# Patient Record
Sex: Male | Born: 1935 | Race: Black or African American | Hispanic: No | Smoking: Former smoker
Health system: Southern US, Community
[De-identification: ages and names within clinical notes are randomized; demographics above are authoritative.]

## PROBLEM LIST (undated history)

## (undated) DIAGNOSIS — R51 Headache: Secondary | ICD-10-CM

## (undated) DIAGNOSIS — R0989 Other specified symptoms and signs involving the circulatory and respiratory systems: Secondary | ICD-10-CM

## (undated) DIAGNOSIS — K219 Gastro-esophageal reflux disease without esophagitis: Secondary | ICD-10-CM

## (undated) DIAGNOSIS — Z8719 Personal history of other diseases of the digestive system: Secondary | ICD-10-CM

## (undated) DIAGNOSIS — D509 Iron deficiency anemia, unspecified: Secondary | ICD-10-CM

## (undated) DIAGNOSIS — M1990 Unspecified osteoarthritis, unspecified site: Secondary | ICD-10-CM

## (undated) DIAGNOSIS — E7800 Pure hypercholesterolemia, unspecified: Secondary | ICD-10-CM

## (undated) DIAGNOSIS — R519 Headache, unspecified: Secondary | ICD-10-CM

## (undated) DIAGNOSIS — R32 Unspecified urinary incontinence: Secondary | ICD-10-CM

## (undated) DIAGNOSIS — Z8711 Personal history of peptic ulcer disease: Secondary | ICD-10-CM

## (undated) DIAGNOSIS — I4510 Unspecified right bundle-branch block: Secondary | ICD-10-CM

## (undated) DIAGNOSIS — I10 Essential (primary) hypertension: Secondary | ICD-10-CM

## (undated) HISTORY — DX: Gastro-esophageal reflux disease without esophagitis: K21.9

## (undated) HISTORY — PX: LUMBAR SPINE SURGERY: SHX701

## (undated) HISTORY — DX: Other specified symptoms and signs involving the circulatory and respiratory systems: R09.89

## (undated) HISTORY — PX: INGUINAL HERNIA REPAIR: SUR1180

## (undated) HISTORY — DX: Unspecified osteoarthritis, unspecified site: M19.90

## (undated) HISTORY — DX: Personal history of other diseases of the digestive system: Z87.19

## (undated) HISTORY — DX: Iron deficiency anemia, unspecified: D50.9

## (undated) HISTORY — DX: Pure hypercholesterolemia, unspecified: E78.00

## (undated) HISTORY — DX: Headache, unspecified: R51.9

## (undated) HISTORY — DX: Unspecified right bundle-branch block: I45.10

## (undated) HISTORY — DX: Essential (primary) hypertension: I10

## (undated) HISTORY — DX: Unspecified urinary incontinence: R32

## (undated) HISTORY — PX: HEMORROIDECTOMY: SUR656

## (undated) HISTORY — DX: Headache: R51

## (undated) HISTORY — DX: Personal history of peptic ulcer disease: Z87.11

---

## 1994-12-21 HISTORY — PX: BACK SURGERY: SHX140

## 2001-03-29 ENCOUNTER — Other Ambulatory Visit: Admission: RE | Admit: 2001-03-29 | Discharge: 2001-03-29 | Payer: Self-pay | Admitting: Urology

## 2001-04-01 ENCOUNTER — Ambulatory Visit (HOSPITAL_COMMUNITY): Admission: RE | Admit: 2001-04-01 | Discharge: 2001-04-01 | Payer: Self-pay | Admitting: Urology

## 2001-04-01 ENCOUNTER — Encounter: Payer: Self-pay | Admitting: Urology

## 2001-10-11 ENCOUNTER — Other Ambulatory Visit: Admission: RE | Admit: 2001-10-11 | Discharge: 2001-10-11 | Payer: Self-pay | Admitting: Urology

## 2002-07-24 ENCOUNTER — Other Ambulatory Visit: Admission: RE | Admit: 2002-07-24 | Discharge: 2002-07-24 | Payer: Self-pay | Admitting: Urology

## 2003-02-12 ENCOUNTER — Encounter: Payer: Self-pay | Admitting: Emergency Medicine

## 2003-02-12 ENCOUNTER — Emergency Department (HOSPITAL_COMMUNITY): Admission: EM | Admit: 2003-02-12 | Discharge: 2003-02-12 | Payer: Self-pay | Admitting: Emergency Medicine

## 2003-10-18 ENCOUNTER — Ambulatory Visit (HOSPITAL_COMMUNITY): Admission: RE | Admit: 2003-10-18 | Discharge: 2003-10-18 | Payer: Self-pay | Admitting: Internal Medicine

## 2003-10-22 ENCOUNTER — Ambulatory Visit (HOSPITAL_COMMUNITY): Admission: RE | Admit: 2003-10-22 | Discharge: 2003-10-22 | Payer: Self-pay | Admitting: Internal Medicine

## 2004-04-11 ENCOUNTER — Ambulatory Visit (HOSPITAL_COMMUNITY): Admission: RE | Admit: 2004-04-11 | Discharge: 2004-04-11 | Payer: Self-pay | Admitting: Internal Medicine

## 2004-06-26 ENCOUNTER — Emergency Department (HOSPITAL_COMMUNITY): Admission: EM | Admit: 2004-06-26 | Discharge: 2004-06-26 | Payer: Self-pay | Admitting: Emergency Medicine

## 2005-02-10 ENCOUNTER — Ambulatory Visit (HOSPITAL_COMMUNITY): Admission: RE | Admit: 2005-02-10 | Discharge: 2005-02-10 | Payer: Self-pay | Admitting: Family Medicine

## 2005-03-03 ENCOUNTER — Ambulatory Visit (HOSPITAL_COMMUNITY): Admission: RE | Admit: 2005-03-03 | Discharge: 2005-03-03 | Payer: Self-pay | Admitting: Internal Medicine

## 2005-03-05 ENCOUNTER — Ambulatory Visit (HOSPITAL_COMMUNITY): Admission: RE | Admit: 2005-03-05 | Discharge: 2005-03-05 | Payer: Self-pay | Admitting: Internal Medicine

## 2005-03-12 ENCOUNTER — Ambulatory Visit (HOSPITAL_COMMUNITY): Admission: RE | Admit: 2005-03-12 | Discharge: 2005-03-12 | Payer: Self-pay

## 2005-03-17 ENCOUNTER — Ambulatory Visit (HOSPITAL_COMMUNITY): Admission: RE | Admit: 2005-03-17 | Discharge: 2005-03-17 | Payer: Self-pay

## 2006-02-23 ENCOUNTER — Ambulatory Visit (HOSPITAL_COMMUNITY): Admission: RE | Admit: 2006-02-23 | Discharge: 2006-02-23 | Payer: Self-pay | Admitting: Internal Medicine

## 2007-08-19 ENCOUNTER — Ambulatory Visit: Payer: Self-pay

## 2009-01-29 ENCOUNTER — Ambulatory Visit (HOSPITAL_COMMUNITY): Admission: RE | Admit: 2009-01-29 | Discharge: 2009-01-29 | Payer: Self-pay | Admitting: Urology

## 2009-09-01 ENCOUNTER — Emergency Department (HOSPITAL_COMMUNITY): Admission: EM | Admit: 2009-09-01 | Discharge: 2009-09-01 | Payer: Self-pay | Admitting: Emergency Medicine

## 2009-09-02 ENCOUNTER — Ambulatory Visit (HOSPITAL_COMMUNITY): Admission: RE | Admit: 2009-09-02 | Discharge: 2009-09-02 | Payer: Self-pay | Admitting: Family Medicine

## 2009-10-01 ENCOUNTER — Encounter (INDEPENDENT_AMBULATORY_CARE_PROVIDER_SITE_OTHER): Payer: Self-pay | Admitting: Orthopedic Surgery

## 2009-10-01 ENCOUNTER — Ambulatory Visit (HOSPITAL_COMMUNITY): Admission: RE | Admit: 2009-10-01 | Discharge: 2009-10-01 | Payer: Self-pay | Admitting: Orthopedic Surgery

## 2011-01-11 ENCOUNTER — Encounter: Payer: Self-pay | Admitting: Urology

## 2011-02-17 ENCOUNTER — Institutional Professional Consult (permissible substitution) (INDEPENDENT_AMBULATORY_CARE_PROVIDER_SITE_OTHER): Payer: Medicare Other | Admitting: Cardiovascular Disease

## 2011-02-17 DIAGNOSIS — I4510 Unspecified right bundle-branch block: Secondary | ICD-10-CM

## 2011-02-17 DIAGNOSIS — I119 Hypertensive heart disease without heart failure: Secondary | ICD-10-CM

## 2011-04-07 LAB — CREATININE, SERUM: Creatinine, Ser: 1.59 mg/dL — ABNORMAL HIGH (ref 0.4–1.5)

## 2011-05-08 NOTE — Procedures (Signed)
NAMEELRIDGE, STEMM                  ACCOUNT NO.:  [id number]   MEDICAL RECORD NO.:  [id number]          PATIENT TYPE:  OUT   LOCATION:                                FACILITY:  [facility]   PHYSICIAN:  Kofi A. Gerilyn Pilgrim, M.D. DATE OF BIRTH:  Mar 05, 1927   DATE OF PROCEDURE:  DATE OF DISCHARGE:                                EEG INTERPRETATION   HISTORY:  This is a 77 year old man who has syncopal episodes suspicious for  nonconvulsive seizures.   ANALYSIS:  A 16-channel recording is conducted for 28 minutes.  There is a  posterior dominant rhythm of 10 Hz.  Awake activity is noted throughout the  recording with some occasional drowsiness observed.  Photic stimulation does  not elicit any significant changes in the background activity.  There is no  focal slowing, lateralized slowing or epileptiform activity observed.   IMPRESSION:  This is a normal recording of the awake state.  If clinically  indicated a sleep-deprived recording could be useful.      KAD/MEDQ  D:  03/09/2005  T:  03/09/2005  Job:  [id number]

## 2011-10-09 ENCOUNTER — Other Ambulatory Visit (HOSPITAL_COMMUNITY): Payer: Self-pay | Admitting: Internal Medicine

## 2011-10-09 DIAGNOSIS — R109 Unspecified abdominal pain: Secondary | ICD-10-CM

## 2011-10-20 ENCOUNTER — Ambulatory Visit (HOSPITAL_COMMUNITY)
Admission: RE | Admit: 2011-10-20 | Discharge: 2011-10-20 | Disposition: A | Discharge status: Home / Self Care | Payer: Medicare Other | Source: Ambulatory Visit | Attending: Internal Medicine | Admitting: Internal Medicine

## 2011-10-20 DIAGNOSIS — R109 Unspecified abdominal pain: Secondary | ICD-10-CM | POA: Insufficient documentation

## 2011-10-20 DIAGNOSIS — Q619 Cystic kidney disease, unspecified: Secondary | ICD-10-CM | POA: Insufficient documentation

## 2012-05-13 ENCOUNTER — Encounter: Payer: Self-pay | Admitting: *Deleted

## 2012-07-01 ENCOUNTER — Ambulatory Visit: Payer: Self-pay | Admitting: Family Medicine

## 2012-08-30 ENCOUNTER — Ambulatory Visit: Payer: Self-pay | Admitting: Internal Medicine

## 2012-08-30 LAB — CK-MB: CK-MB: 2.4 ng/mL (ref 0.5–3.6)

## 2012-08-31 LAB — BASIC METABOLIC PANEL
Anion Gap: 9 (ref 7–16)
Calcium, Total: 8.4 mg/dL — ABNORMAL LOW (ref 8.5–10.1)
Chloride: 107 mmol/L (ref 98–107)
EGFR (Non-African Amer.): 42 — ABNORMAL LOW
Glucose: 84 mg/dL (ref 65–99)
Osmolality: 280 (ref 275–301)
Potassium: 4.2 mmol/L (ref 3.5–5.1)

## 2012-09-23 ENCOUNTER — Inpatient Hospital Stay: Payer: Self-pay | Admitting: Family Medicine

## 2012-09-23 LAB — URINALYSIS, COMPLETE
Bilirubin,UR: NEGATIVE
Glucose,UR: NEGATIVE mg/dL (ref 0–75)
Ketone: NEGATIVE
Nitrite: NEGATIVE
Ph: 5 (ref 4.5–8.0)
Protein: NEGATIVE
RBC,UR: <1 /HPF (ref 0–5)
Specific Gravity: 1.01 (ref 1.003–1.030)
WBC UR: <1 /HPF (ref 0–5)

## 2012-09-23 LAB — CBC
HCT: 15.6 % — ABNORMAL LOW (ref 40.0–52.0)
MCV: 67 fL — ABNORMAL LOW (ref 80–100)
Platelet: 221 10*3/uL (ref 150–440)
RBC: 2.31 10*6/uL — ABNORMAL LOW (ref 4.40–5.90)
RDW: 16.6 % — ABNORMAL HIGH (ref 11.5–14.5)
WBC: 4.3 10*3/uL (ref 3.8–10.6)

## 2012-09-23 LAB — COMPREHENSIVE METABOLIC PANEL
Albumin: 3.4 g/dL (ref 3.4–5.0)
Alkaline Phosphatase: 56 U/L (ref 50–136)
Anion Gap: 11 (ref 7–16)
Bilirubin,Total: 0.3 mg/dL (ref 0.2–1.0)
Calcium, Total: 8.6 mg/dL (ref 8.5–10.1)
Chloride: 109 mmol/L — ABNORMAL HIGH (ref 98–107)
Co2: 21 mmol/L (ref 21–32)
Creatinine: 2.23 mg/dL — ABNORMAL HIGH (ref 0.60–1.30)
Osmolality: 288 (ref 275–301)
SGOT(AST): 20 U/L (ref 15–37)
Total Protein: 7.5 g/dL (ref 6.4–8.2)

## 2012-09-23 LAB — TROPONIN I
Troponin-I: <0.02 ng/mL
Troponin-I: <0.02 ng/mL

## 2012-09-23 LAB — LIPASE, BLOOD: Lipase: 183 U/L (ref 73–393)

## 2012-09-23 LAB — IRON AND TIBC
Iron Bind.Cap.(Total): 423 ug/dL (ref 250–450)
Unbound Iron-Bind.Cap.: 407 ug/dL

## 2012-09-23 LAB — LACTATE DEHYDROGENASE: LDH: 167 U/L (ref 85–241)

## 2012-09-23 LAB — FERRITIN: Ferritin (ARMC): 7 ng/mL — ABNORMAL LOW (ref 8–388)

## 2012-09-23 LAB — CK TOTAL AND CKMB (NOT AT ARMC)
CK, Total: 169 U/L (ref 35–232)
CK-MB: 2.5 ng/mL (ref 0.5–3.6)

## 2012-09-23 LAB — HEMOGLOBIN: HGB: 5 g/dL — CL (ref 13.0–18.0)

## 2012-09-24 LAB — COMPREHENSIVE METABOLIC PANEL
Albumin: 3.2 g/dL — ABNORMAL LOW (ref 3.4–5.0)
Alkaline Phosphatase: 43 U/L — ABNORMAL LOW (ref 50–136)
Anion Gap: 11 (ref 7–16)
BUN: 29 mg/dL — ABNORMAL HIGH (ref 7–18)
Calcium, Total: 8.4 mg/dL — ABNORMAL LOW (ref 8.5–10.1)
Co2: 23 mmol/L (ref 21–32)
EGFR (Non-African Amer.): 28 — ABNORMAL LOW
Glucose: 89 mg/dL (ref 65–99)
Osmolality: 288 (ref 275–301)
Potassium: 4.2 mmol/L (ref 3.5–5.1)
SGOT(AST): 15 U/L (ref 15–37)
SGPT (ALT): 13 U/L (ref 12–78)
Sodium: 142 mmol/L (ref 136–145)

## 2012-09-24 LAB — CBC WITH DIFFERENTIAL/PLATELET
Basophil #: 0 10*3/uL (ref 0.0–0.1)
Basophil %: 0.9 %
Eosinophil #: 0.1 10*3/uL (ref 0.0–0.7)
HCT: 20.5 % — ABNORMAL LOW (ref 40.0–52.0)
HGB: 6.9 g/dL — ABNORMAL LOW (ref 13.0–18.0)
Lymphocyte %: 17.8 %
MCH: 24.2 pg — ABNORMAL LOW (ref 26.0–34.0)
MCHC: 33.8 g/dL (ref 32.0–36.0)
Monocyte #: 0.3 x10 3/mm (ref 0.2–1.0)
Monocyte %: 7.4 %
Neutrophil #: 3.1 10*3/uL (ref 1.4–6.5)
RBC: 2.85 10*6/uL — ABNORMAL LOW (ref 4.40–5.90)
RDW: 20.2 % — ABNORMAL HIGH (ref 11.5–14.5)
WBC: 4.3 10*3/uL (ref 3.8–10.6)

## 2012-09-24 LAB — HEMOGLOBIN: HGB: 9.5 g/dL — ABNORMAL LOW (ref 13.0–18.0)

## 2012-09-24 LAB — PROTIME-INR: Prothrombin Time: 15 secs — ABNORMAL HIGH (ref 11.5–14.7)

## 2012-09-25 LAB — CBC WITH DIFFERENTIAL/PLATELET
Basophil %: 0.7 %
Eosinophil #: 0.1 10*3/uL (ref 0.0–0.7)
Eosinophil %: 2.1 %
HCT: 27.7 % — ABNORMAL LOW (ref 40.0–52.0)
HGB: 9.3 g/dL — ABNORMAL LOW (ref 13.0–18.0)
Lymphocyte #: 0.8 10*3/uL — ABNORMAL LOW (ref 1.0–3.6)
Lymphocyte %: 21.5 %
MCV: 75 fL — ABNORMAL LOW (ref 80–100)
Monocyte #: 0.3 x10 3/mm (ref 0.2–1.0)
Monocyte %: 8.1 %
Neutrophil #: 2.4 10*3/uL (ref 1.4–6.5)
Neutrophil %: 67.6 %
RBC: 3.71 10*6/uL — ABNORMAL LOW (ref 4.40–5.90)
RDW: 21.2 % — ABNORMAL HIGH (ref 11.5–14.5)
WBC: 3.6 10*3/uL — ABNORMAL LOW (ref 3.8–10.6)

## 2012-09-25 LAB — BASIC METABOLIC PANEL
Anion Gap: 11 (ref 7–16)
BUN: 24 mg/dL — ABNORMAL HIGH (ref 7–18)
Chloride: 109 mmol/L — ABNORMAL HIGH (ref 98–107)
EGFR (Non-African Amer.): 35 — ABNORMAL LOW
Osmolality: 283 (ref 275–301)
Potassium: 4.4 mmol/L (ref 3.5–5.1)
Sodium: 140 mmol/L (ref 136–145)

## 2012-09-26 LAB — CBC WITH DIFFERENTIAL/PLATELET
Basophil #: 0 10*3/uL (ref 0.0–0.1)
Basophil %: 0.4 %
Eosinophil #: 0 10*3/uL (ref 0.0–0.7)
Eosinophil %: 1 %
HCT: 29.2 % — ABNORMAL LOW (ref 40.0–52.0)
HGB: 9.5 g/dL — ABNORMAL LOW (ref 13.0–18.0)
Lymphocyte %: 18.7 %
MCHC: 32.6 g/dL (ref 32.0–36.0)
Monocyte %: 7.4 %
Neutrophil %: 72.5 %
Platelet: 190 10*3/uL (ref 150–440)
RBC: 3.86 10*6/uL — ABNORMAL LOW (ref 4.40–5.90)
WBC: 4.3 10*3/uL (ref 3.8–10.6)

## 2012-09-26 LAB — BASIC METABOLIC PANEL
Anion Gap: 9 (ref 7–16)
Calcium, Total: 8.4 mg/dL — ABNORMAL LOW (ref 8.5–10.1)
Chloride: 109 mmol/L — ABNORMAL HIGH (ref 98–107)
Co2: 22 mmol/L (ref 21–32)
Glucose: 92 mg/dL (ref 65–99)
Potassium: 4.5 mmol/L (ref 3.5–5.1)
Sodium: 140 mmol/L (ref 136–145)

## 2012-09-28 LAB — CBC WITH DIFFERENTIAL/PLATELET
Basophil %: 0.6 %
Eosinophil #: 0 10*3/uL (ref 0.0–0.7)
Eosinophil #: 0 10*3/uL (ref 0.0–0.7)
Eosinophil %: 1.3 %
Eosinophil %: 1 %
HCT: 27.9 % — ABNORMAL LOW (ref 40.0–52.0)
HGB: 8.4 g/dL — ABNORMAL LOW (ref 13.0–18.0)
Lymphocyte %: 22.5 %
Lymphocyte %: 22.1 %
MCH: 25.4 pg — ABNORMAL LOW (ref 26.0–34.0)
MCHC: 33.2 g/dL (ref 32.0–36.0)
Monocyte #: 0.3 x10 3/mm (ref 0.2–1.0)
Monocyte %: 9.7 %
Neutrophil #: 2.3 10*3/uL (ref 1.4–6.5)
Neutrophil %: 65.9 %
Neutrophil %: 67.6 %
Platelet: 170 10*3/uL (ref 150–440)
RBC: 3.34 10*6/uL — ABNORMAL LOW (ref 4.40–5.90)
RBC: 3.64 10*6/uL — ABNORMAL LOW (ref 4.40–5.90)
RDW: 22.1 % — ABNORMAL HIGH (ref 11.5–14.5)
WBC: 3.4 10*3/uL — ABNORMAL LOW (ref 3.8–10.6)
WBC: 3.4 10*3/uL — ABNORMAL LOW (ref 3.8–10.6)

## 2012-09-28 LAB — BASIC METABOLIC PANEL
Anion Gap: 10 (ref 7–16)
BUN: 17 mg/dL (ref 7–18)
Chloride: 111 mmol/L — ABNORMAL HIGH (ref 98–107)
Co2: 23 mmol/L (ref 21–32)
EGFR (Non-African Amer.): 40 — ABNORMAL LOW
Glucose: 104 mg/dL — ABNORMAL HIGH (ref 65–99)
Osmolality: 289 (ref 275–301)
Potassium: 4.1 mmol/L (ref 3.5–5.1)
Sodium: 144 mmol/L (ref 136–145)

## 2012-09-28 LAB — PATHOLOGY REPORT

## 2013-05-17 ENCOUNTER — Inpatient Hospital Stay (HOSPITAL_COMMUNITY): Admission: RE | Admit: 2013-05-17 | Payer: Medicare Other | Source: Ambulatory Visit

## 2013-05-23 ENCOUNTER — Other Ambulatory Visit (HOSPITAL_COMMUNITY): Payer: Self-pay | Admitting: *Deleted

## 2013-05-24 ENCOUNTER — Encounter (HOSPITAL_COMMUNITY): Payer: Medicare Other

## 2013-06-07 ENCOUNTER — Encounter (HOSPITAL_COMMUNITY)
Admission: RE | Admit: 2013-06-07 | Discharge: 2013-06-07 | Disposition: A | Discharge status: Home / Self Care | Payer: Medicare Other | Source: Ambulatory Visit | Attending: Nephrology | Admitting: Nephrology

## 2013-06-07 DIAGNOSIS — Q619 Cystic kidney disease, unspecified: Secondary | ICD-10-CM | POA: Insufficient documentation

## 2013-06-07 DIAGNOSIS — D509 Iron deficiency anemia, unspecified: Secondary | ICD-10-CM | POA: Insufficient documentation

## 2013-06-07 MED ORDER — EPOETIN ALFA 40000 UNIT/ML IJ SOLN
INTRAMUSCULAR | Status: AC
  Filled 2013-06-07: qty 1

## 2013-06-07 MED ORDER — EPOETIN ALFA 40000 UNIT/ML IJ SOLN
40000.0000 [IU] | INTRAMUSCULAR | Status: DC
  Administered 2013-06-07: 40000 [IU] via SUBCUTANEOUS

## 2013-06-08 LAB — POCT HEMOGLOBIN-HEMACUE: Hemoglobin: 8.1 g/dL — ABNORMAL LOW (ref 13.0–17.0)

## 2013-06-20 ENCOUNTER — Other Ambulatory Visit (HOSPITAL_COMMUNITY): Payer: Self-pay | Admitting: *Deleted

## 2013-06-21 ENCOUNTER — Encounter (HOSPITAL_COMMUNITY)
Admission: RE | Admit: 2013-06-21 | Discharge: 2013-06-21 | Disposition: A | Discharge status: Home / Self Care | Payer: Medicare Other | Source: Ambulatory Visit | Attending: Nephrology | Admitting: Nephrology

## 2013-06-21 DIAGNOSIS — I10 Essential (primary) hypertension: Secondary | ICD-10-CM | POA: Insufficient documentation

## 2013-06-21 DIAGNOSIS — I4510 Unspecified right bundle-branch block: Secondary | ICD-10-CM | POA: Insufficient documentation

## 2013-06-21 DIAGNOSIS — R0989 Other specified symptoms and signs involving the circulatory and respiratory systems: Secondary | ICD-10-CM | POA: Insufficient documentation

## 2013-06-21 DIAGNOSIS — D509 Iron deficiency anemia, unspecified: Secondary | ICD-10-CM | POA: Insufficient documentation

## 2013-06-21 DIAGNOSIS — N289 Disorder of kidney and ureter, unspecified: Secondary | ICD-10-CM | POA: Insufficient documentation

## 2013-06-21 DIAGNOSIS — Q619 Cystic kidney disease, unspecified: Secondary | ICD-10-CM | POA: Insufficient documentation

## 2013-06-21 LAB — IRON AND TIBC
Saturation Ratios: 5 % — ABNORMAL LOW (ref 20–55)
TIBC: 371 ug/dL (ref 215–435)

## 2013-06-21 LAB — FERRITIN: Ferritin: 7 ng/mL — ABNORMAL LOW (ref 22–322)

## 2013-06-21 MED ORDER — EPOETIN ALFA 40000 UNIT/ML IJ SOLN
40000.0000 [IU] | INTRAMUSCULAR | Status: DC
  Administered 2013-06-21: 40000 [IU] via SUBCUTANEOUS

## 2013-06-21 MED ORDER — EPOETIN ALFA 40000 UNIT/ML IJ SOLN
INTRAMUSCULAR | Status: AC
  Filled 2013-06-21: qty 1

## 2013-06-27 ENCOUNTER — Other Ambulatory Visit (HOSPITAL_COMMUNITY): Payer: Self-pay | Admitting: *Deleted

## 2013-06-28 ENCOUNTER — Encounter (HOSPITAL_COMMUNITY)
Admission: RE | Admit: 2013-06-28 | Discharge: 2013-06-28 | Disposition: A | Discharge status: Home / Self Care | Payer: Medicare Other | Source: Ambulatory Visit | Attending: Nephrology | Admitting: Nephrology

## 2013-06-28 LAB — POCT HEMOGLOBIN-HEMACUE: Hemoglobin: 8.1 g/dL — ABNORMAL LOW (ref 13.0–17.0)

## 2013-06-28 MED ORDER — SODIUM CHLORIDE 0.9 % IV SOLN
1020.0000 mg | Freq: Once | INTRAVENOUS | Status: AC
  Administered 2013-06-28: 1020 mg via INTRAVENOUS
  Filled 2013-06-28: qty 34

## 2013-06-28 MED ORDER — EPOETIN ALFA 40000 UNIT/ML IJ SOLN
INTRAMUSCULAR | Status: AC
  Filled 2013-06-28: qty 1

## 2013-06-28 MED ORDER — EPOETIN ALFA 40000 UNIT/ML IJ SOLN
40000.0000 [IU] | INTRAMUSCULAR | Status: DC
  Administered 2013-06-28: 40000 [IU] via SUBCUTANEOUS

## 2013-07-05 ENCOUNTER — Encounter (HOSPITAL_COMMUNITY)
Admission: RE | Admit: 2013-07-05 | Discharge: 2013-07-05 | Disposition: A | Discharge status: Home / Self Care | Payer: Medicare Other | Source: Ambulatory Visit | Attending: Nephrology | Admitting: Nephrology

## 2013-07-05 LAB — POCT HEMOGLOBIN-HEMACUE: Hemoglobin: 9 g/dL — ABNORMAL LOW (ref 13.0–17.0)

## 2013-07-05 MED ORDER — EPOETIN ALFA 40000 UNIT/ML IJ SOLN
INTRAMUSCULAR | Status: AC
  Administered 2013-07-05: 40000 [IU] via SUBCUTANEOUS
  Filled 2013-07-05: qty 1

## 2013-07-05 MED ORDER — EPOETIN ALFA 40000 UNIT/ML IJ SOLN: 40000.0000 [IU] | INTRAMUSCULAR | Status: DC

## 2013-07-12 ENCOUNTER — Encounter: Payer: Self-pay | Admitting: Physician Assistant

## 2013-07-12 ENCOUNTER — Encounter (HOSPITAL_COMMUNITY): Payer: Medicare Other

## 2013-07-12 ENCOUNTER — Ambulatory Visit (INDEPENDENT_AMBULATORY_CARE_PROVIDER_SITE_OTHER): Payer: Medicare Other | Admitting: Physician Assistant

## 2013-07-12 VITALS — BP 150/80 | HR 68 | Ht 66.0 in | Wt 157.0 lb

## 2013-07-12 DIAGNOSIS — R079 Chest pain, unspecified: Secondary | ICD-10-CM | POA: Insufficient documentation

## 2013-07-12 DIAGNOSIS — I10 Essential (primary) hypertension: Secondary | ICD-10-CM | POA: Insufficient documentation

## 2013-07-12 DIAGNOSIS — R0989 Other specified symptoms and signs involving the circulatory and respiratory systems: Secondary | ICD-10-CM | POA: Insufficient documentation

## 2013-07-12 DIAGNOSIS — K219 Gastro-esophageal reflux disease without esophagitis: Secondary | ICD-10-CM | POA: Insufficient documentation

## 2013-07-12 DIAGNOSIS — D509 Iron deficiency anemia, unspecified: Secondary | ICD-10-CM

## 2013-07-12 DIAGNOSIS — Z01810 Encounter for preprocedural cardiovascular examination: Secondary | ICD-10-CM

## 2013-07-12 DIAGNOSIS — I4510 Unspecified right bundle-branch block: Secondary | ICD-10-CM | POA: Insufficient documentation

## 2013-07-12 DIAGNOSIS — N289 Disorder of kidney and ureter, unspecified: Secondary | ICD-10-CM | POA: Insufficient documentation

## 2013-07-12 DIAGNOSIS — Z01818 Encounter for other preprocedural examination: Secondary | ICD-10-CM | POA: Insufficient documentation

## 2013-07-12 NOTE — Assessment & Plan Note (Signed)
Patient's blood pressure is elevated. He has no idea which medications he is actually taking. He is to go home and call us with the list of current medications that he is taking. 2 g sodium diet.

## 2013-07-12 NOTE — Assessment & Plan Note (Signed)
Followed by Dr. Kinnie Scales and awaiting endoscopy

## 2013-07-12 NOTE — Assessment & Plan Note (Signed)
Patient hasn't had carotid Dopplers since 2006. We will order.

## 2013-07-12 NOTE — Patient Instructions (Addendum)
PLEASE SCHEDULE EXERCISE MYOVIEW; DX V72.81  PLEASE SCHEDULE CAROTIDS; DX V72.81  LABS CAN BE DONE SAME DAY AS MYOVIEW OR CAROTID; ( CBC W/DIFF, TSH, CMET)  YOU WILL NEED TO FOLLOW UP WITH DR. Elease Hashimoto AFTER YOUR TEST AFTER HAVE BEEN DONE BEFORE YOU CAN HAVE YOUR SURGERY  MAKE SURE TO CALL us WHEN YOU GET HOME TODAY WITH YOUR CORRECT MEDICATION LIST. [phone]   2 Gram Low Sodium Diet A 2 gram sodium diet restricts the amount of sodium in the diet to no more than 2 g or 2000 mg daily. Limiting the amount of sodium is often used to help lower blood pressure. It is important if you have heart, liver, or kidney problems. Many foods contain sodium for flavor and sometimes as a preservative. When the amount of sodium in a diet needs to be low, it is important to know what to look for when choosing foods and drinks. The following includes some information and guidelines to help make it easier for you to adapt to a low sodium diet. QUICK TIPS  Do not add salt to food.  Avoid convenience items and fast food.  Choose unsalted snack foods.  Buy lower sodium products, often labeled as "lower sodium" or "no salt added."  Check food labels to learn how much sodium is in 1 serving.  When eating at a restaurant, ask that your food be prepared with less salt or none, if possible. READING FOOD LABELS FOR SODIUM INFORMATION The nutrition facts label is a good place to find how much sodium is in foods. Look for products with no more than 500 to 600 mg of sodium per meal and no more than 150 mg per serving. Remember that 2 g = 2000 mg. The food label may also list foods as:  Sodium-free: Less than 5 mg in a serving.  Very low sodium: 35 mg or less in a serving.  Low-sodium: 140 mg or less in a serving.  Light in sodium: 50% less sodium in a serving. For example, if a food that usually has 300 mg of sodium is changed to become light in sodium, it will have 150 mg of sodium.  Reduced sodium: 25% less  sodium in a serving. For example, if a food that usually has 400 mg of sodium is changed to reduced sodium, it will have 300 mg of sodium. CHOOSING FOODS Grains  Avoid: Salted crackers and snack items. Some cereals, including instant hot cereals. Bread stuffing and biscuit mixes. Seasoned rice or pasta mixes.  Choose: Unsalted snack items. Low-sodium cereals, oats, puffed wheat and rice, shredded wheat. English muffins and bread. Pasta. Meats  Avoid: Salted, canned, smoked, spiced, pickled meats, including fish and poultry. Bacon, ham, sausage, cold cuts, hot dogs, anchovies.  Choose: Low-sodium canned tuna and salmon. Fresh or frozen meat, poultry, and fish. Dairy  Avoid: Processed cheese and spreads. Cottage cheese. Buttermilk and condensed milk. Regular cheese.  Choose: Milk. Low-sodium cottage cheese. Yogurt. Sour cream. Low-sodium cheese. Fruits and Vegetables  Avoid: Regular canned vegetables. Regular canned tomato sauce and paste. Frozen vegetables in sauces. Olives. Rosita Fire. Relishes. Sauerkraut.  Choose: Low-sodium canned vegetables. Low-sodium tomato sauce and paste. Frozen or fresh vegetables. Fresh and frozen fruit. Condiments  Avoid: Canned and packaged gravies. Worcestershire sauce. Tartar sauce. Barbecue sauce. Soy sauce. Steak sauce. Ketchup. Onion, garlic, and table salt. Meat flavorings and tenderizers.  Choose: Fresh and dried herbs and spices. Low-sodium varieties of mustard and ketchup. Lemon juice. Tabasco sauce. Horseradish. SAMPLE 2  GRAM SODIUM MEAL PLAN Breakfast / Sodium (mg)  1 cup low-fat milk / 143 mg  2 slices whole-wheat toast / 270 mg  1 tbs heart-healthy margarine / 153 mg  1 hard-boiled egg / 139 mg  1 small orange / 0 mg Lunch / Sodium (mg)  1 cup raw carrots / 76 mg   cup hummus / 298 mg  1 cup low-fat milk / 143 mg   cup red grapes / 2 mg  1 whole-wheat pita bread / 356 mg Dinner / Sodium (mg)  1 cup whole-wheat pasta / 2  mg  1 cup low-sodium tomato sauce / 73 mg  3 oz lean ground beef / 57 mg  1 small side salad (1 cup raw spinach leaves,  cup cucumber,  cup yellow bell pepper) with 1 tsp olive oil and 1 tsp red wine vinegar / 25 mg Snack / Sodium (mg)  1 container low-fat vanilla yogurt / 107 mg  3 graham cracker squares / 127 mg Nutrient Analysis  Calories: 2033  Protein: 77 g  Carbohydrate: 282 g  Fat: 72 g  Sodium: 1971 mg Document Released: 12/07/2005 Document Revised: 02/29/2012 Document Reviewed: 03/10/2010 [employer] Patient Information 2014 [place], [place].

## 2013-07-12 NOTE — Assessment & Plan Note (Signed)
Patient has 3-4 month history of chest tightness and dyspnea on exertion when working on [employer] or cutting wood. He is also suffering from iron deficiency anemia which could be contributing. We will order a stress Myoview to rule out ischemia.

## 2013-07-12 NOTE — Progress Notes (Signed)
HPI:   This is a 79 year old male Griffin of Dr. Elease Hashimoto, Medoff, and [place] who is here for presurgical clearance before undergoing endoscopy with possible dilatation of his esophagus.  He was seen by Dr.Nahser for the first time in 2012 for hypertension and right bundle branch block. He was followed conservatively and has not been seen since.  The Griffin also has a history of a right carotid bruit but hasn't had Doppler since 2006. He also has iron deficiency anemia for which he receives shots every week.  The Griffin comes in today complaining of approximately 3 month history of dyspnea on exertion. He thinks it's related to his anemia. He also has an associated chest tightness that goes up his entire chest but eases when he rests. This occurs when he is working on [employer]. He has similar symptoms after he eats meat so it's difficult to distinguish GI versus heart. He is a difficult historian and does not know which medications he is taking. He denies any dizziness, palpitations, or presyncope.  No Known Allergies  Current Outpatient Prescriptions on File Prior to Visit: ALPRAZolam (XANAX) 0.5 MG tablet, Take 0.5 mg by mouth as needed., Disp: , Rfl:  amLODipine (NORVASC) 5 MG tablet, Take 5 mg by mouth daily., Disp: , Rfl:  carvedilol (COREG) 12.5 MG tablet, Take 12.5 mg by mouth 2 (two) times daily with a meal., Disp: , Rfl:  esomeprazole (NEXIUM) 40 MG capsule, Take 40 mg by mouth daily before breakfast., Disp: , Rfl:  furosemide (LASIX) 40 MG tablet, Take 40 mg by mouth 2 (two) times daily., Disp: , Rfl:  pravastatin (PRAVACHOL) 40 MG tablet, Take 40 mg by mouth daily., Disp: , Rfl:  pregabalin (LYRICA) 50 MG capsule, Take 50 mg by mouth 3 (three) times daily., Disp: , Rfl:  telmisartan-hydrochlorothiazide (MICARDIS HCT) 80-12.5 MG per tablet, Take 1 tablet by mouth daily., Disp: , Rfl:   No current facility-administered medications on file prior to visit.   Past  Medical History:   HTN (hypertension)                                           Right carotid bruit                                          Hypercholesteremia                                           RBBB                                                         GERD (gastroesophageal reflux disease)                       Iron deficiency anemia  Past Surgical History:   INGUINAL HERNIA REPAIR                                        LUMBAR SPINE SURGERY                                            Comment:x2   HEMORROIDECTOMY                                              Review of Griffin's family history indicates:   Stroke                                                  Diabetes                                                Aneurysm                                                Breast cancer                                           Hyperlipidemia                                          Social History   Marital Status: Married             Spouse Name:                      Years of Education:                 Number of children:             Occupational History Occupation          Associate Professor            Comment              retired                                   Social History Main Topics   Smoking Status: Former Smoker                   Packs/Day: 0.50  Years: 5         Quit date: 12/21/1961   Smokeless Status: Not on file  Alcohol Use: No             Drug Use: Not on file    Sexual Activity: Not on file        Other Topics            Concern   None on file  Social History Narrative   None on file    ROS: Hard of hearing, approximately 15-20 pound weight loss since he has been iron deficient and taking iron supplements, chronic foot problems, occasional ankle edema for which he takes Lasix once or twice a week ,otherwise see history of present illness   PHYSICAL EXAM: Well-nournished, in no acute distress. Neck:  Soft right carotid bruit, No JVD, HJR, or thyroid enlargement  Lungs: No tachypnea, clear without wheezing, rales, or rhonchi  Cardiovascular: RRR, PMI not displaced, heart sounds normal, no murmurs, gallops, bruit, thrill, or heave.  Abdomen: BS normal. Soft without organomegaly, masses, lesions or tenderness.  Extremities: without cyanosis, clubbing or edema. Good distal pulses bilateral  SKin: Warm, no lesions or rashes   Musculoskeletal: No deformities  Neuro: no focal signs  BP 150/80  Pulse 68  Ht 5\' 6"  (1.676 m)  Wt 157 lb (71.215 kg)  BMI 25.35 kg/m2    EKG: Normal sinus rhythm with right bundle branch block

## 2013-07-12 NOTE — Assessment & Plan Note (Signed)
Patient has history of renal insufficiency and has seen Dr. Darrick Penna in the past. We will order chemistries as they have not been done in a long time.

## 2013-07-12 NOTE — Assessment & Plan Note (Signed)
Patient is here for preoperative clearance before undergoing endoscopy and possible esophageal dilatation by Dr. Kinnie Scales. He is having a 3-4 month history of dyspnea on exertion and chest tightness. His history is complicated by iron deficiency anemia for which he receives weekly injections as well as chest pain when eating meats and solid foods. We will order a stress Myoview to rule out ischemia.

## 2013-07-12 NOTE — Assessment & Plan Note (Signed)
Patient receives weekly injections for iron deficiency anemia. His last hemoglobin was 9.0

## 2013-07-12 NOTE — Assessment & Plan Note (Signed)
Patient had this in 2012

## 2013-07-14 ENCOUNTER — Encounter (HOSPITAL_COMMUNITY)
Admission: RE | Admit: 2013-07-14 | Discharge: 2013-07-14 | Disposition: A | Discharge status: Home / Self Care | Payer: Medicare Other | Source: Ambulatory Visit | Attending: Nephrology | Admitting: Nephrology

## 2013-07-14 MED ORDER — EPOETIN ALFA 40000 UNIT/ML IJ SOLN
INTRAMUSCULAR | Status: AC
  Administered 2013-07-14: 40000 [IU] via SUBCUTANEOUS
  Filled 2013-07-14: qty 1

## 2013-07-14 MED ORDER — EPOETIN ALFA 40000 UNIT/ML IJ SOLN: 40000.0000 [IU] | INTRAMUSCULAR | Status: DC

## 2013-07-17 ENCOUNTER — Other Ambulatory Visit: Payer: Self-pay | Admitting: *Deleted

## 2013-07-17 ENCOUNTER — Ambulatory Visit (HOSPITAL_COMMUNITY): Payer: Medicare Other | Attending: Physician Assistant | Admitting: Radiology

## 2013-07-17 ENCOUNTER — Other Ambulatory Visit (INDEPENDENT_AMBULATORY_CARE_PROVIDER_SITE_OTHER): Payer: Medicare Other

## 2013-07-17 VITALS — BP 133/67 | Ht 66.0 in | Wt 154.0 lb

## 2013-07-17 DIAGNOSIS — I4510 Unspecified right bundle-branch block: Secondary | ICD-10-CM | POA: Insufficient documentation

## 2013-07-17 DIAGNOSIS — E785 Hyperlipidemia, unspecified: Secondary | ICD-10-CM

## 2013-07-17 DIAGNOSIS — Z87891 Personal history of nicotine dependence: Secondary | ICD-10-CM | POA: Insufficient documentation

## 2013-07-17 DIAGNOSIS — R0789 Other chest pain: Secondary | ICD-10-CM

## 2013-07-17 DIAGNOSIS — I779 Disorder of arteries and arterioles, unspecified: Secondary | ICD-10-CM | POA: Insufficient documentation

## 2013-07-17 DIAGNOSIS — R0989 Other specified symptoms and signs involving the circulatory and respiratory systems: Secondary | ICD-10-CM | POA: Insufficient documentation

## 2013-07-17 DIAGNOSIS — Z01810 Encounter for preprocedural cardiovascular examination: Secondary | ICD-10-CM

## 2013-07-17 DIAGNOSIS — R0602 Shortness of breath: Secondary | ICD-10-CM | POA: Insufficient documentation

## 2013-07-17 DIAGNOSIS — I10 Essential (primary) hypertension: Secondary | ICD-10-CM | POA: Insufficient documentation

## 2013-07-17 DIAGNOSIS — R0609 Other forms of dyspnea: Secondary | ICD-10-CM | POA: Insufficient documentation

## 2013-07-17 DIAGNOSIS — D509 Iron deficiency anemia, unspecified: Secondary | ICD-10-CM

## 2013-07-17 LAB — CBC WITH DIFFERENTIAL/PLATELET
Basophils Relative: 0 % (ref 0.0–3.0)
Eosinophils Relative: 0.4 % (ref 0.0–5.0)
HCT: 37.7 % — ABNORMAL LOW (ref 39.0–52.0)
Hemoglobin: 11.7 g/dL — ABNORMAL LOW (ref 13.0–17.0)
Lymphs Abs: 0.6 10*3/uL — ABNORMAL LOW (ref 0.7–4.0)
MCV: 72.6 fl — ABNORMAL LOW (ref 78.0–100.0)
Monocytes Absolute: 0.4 10*3/uL (ref 0.1–1.0)
Monocytes Relative: 11.1 % (ref 3.0–12.0)
Neutro Abs: 2.5 10*3/uL (ref 1.4–7.7)
Platelets: 305 10*3/uL (ref 150.0–400.0)
RBC: 5.19 Mil/uL (ref 4.22–5.81)
WBC: 3.5 10*3/uL — ABNORMAL LOW (ref 4.5–10.5)

## 2013-07-17 LAB — COMPREHENSIVE METABOLIC PANEL
ALT: 13 U/L (ref 0–53)
BUN: 20 mg/dL (ref 6–23)
CO2: 28 mEq/L (ref 19–32)
Calcium: 9.4 mg/dL (ref 8.4–10.5)
Chloride: 104 mEq/L (ref 96–112)
Creatinine, Ser: 1.5 mg/dL (ref 0.4–1.5)
GFR: 58.7 mL/min — ABNORMAL LOW (ref >60.00)
Glucose, Bld: 89 mg/dL (ref 70–99)
Total Bilirubin: 0.5 mg/dL (ref 0.3–1.2)

## 2013-07-17 LAB — LIPID PANEL
HDL: 40 mg/dL (ref >39.00)
Triglycerides: 180 mg/dL — ABNORMAL HIGH (ref 0.0–149.0)

## 2013-07-17 LAB — TSH: TSH: 1.23 u[IU]/mL (ref 0.35–5.50)

## 2013-07-17 LAB — LDL CHOLESTEROL, DIRECT: Direct LDL: 180.3 mg/dL

## 2013-07-17 LAB — POCT HEMOGLOBIN-HEMACUE: Hemoglobin: 11 g/dL — ABNORMAL LOW (ref 13.0–17.0)

## 2013-07-17 MED ORDER — TECHNETIUM TC 99M SESTAMIBI GENERIC - CARDIOLITE
11.0000 | Freq: Once | INTRAVENOUS | Status: AC | PRN
  Administered 2013-07-17: 11 via INTRAVENOUS

## 2013-07-17 MED ORDER — TECHNETIUM TC 99M SESTAMIBI GENERIC - CARDIOLITE
33.0000 | Freq: Once | INTRAVENOUS | Status: AC | PRN
  Administered 2013-07-17: 33 via INTRAVENOUS

## 2013-07-17 NOTE — Progress Notes (Signed)
[facility] NUCLEAR MED [address] [place], [place] [address] [phone]    Cardiology Nuclear Med Study  Jake Griffin is a 79 y.o. male     MRN : [id number]     DOB: 1927/09/30  Procedure Date: 07/17/2013  Nuclear Med Background Indication for Stress Test:  Evaluation for Ischemia and Surgical Clearance: Preop endoscopy with dilatation with Dr. Kinnie Scales History:  '08 MPS: EF: 67% NL 2012: [place] ?Stent? Cardiac Risk Factors: Carotid Disease, History of Smoking, Hypertension and RBBB  Symptoms:  Chest Tightness, DOE and SOB   Nuclear Pre-Procedure Caffeine/Decaff Intake:  None NPO After: 10:00pm   Lungs:  clear O2 Sat: 97% on room air. IV 0.9% NS with Angio Cath:  20g  IV Site: R Hand  IV Started by:  Cathlyn Parsons, RN  Chest Size (in):  46 Cup Size: n/a  Height: 5\' 6"  (1.676 m)  Weight:  154 lb (69.854 kg)  BMI:  Body mass index is 24.87 kg/(m^2). Tech Comments:  Patient states he is not taken Coreg.    Nuclear Med Study 1 or 2 day study: 1 day  Stress Test Type:  Stress  Reading MD: Dietrich Pates, MD  Order Authorizing Provider:  Jannette Spanner  Resting Radionuclide: Technetium 39m Sestamibi  Resting Radionuclide Dose: 11.0 mCi   Stress Radionuclide:  Technetium 71m Sestamibi  Stress Radionuclide Dose: 33.0 mCi           Stress Protocol Rest HR: 55 Stress HR: 125  Rest BP: 133/67 Stress BP: 203/83  Exercise Time (min): 3:00 METS: 4.60   Predicted Max HR: 137 bpm % Max HR: 91.24 bpm Rate Pressure Product: 86578   Dose of Adenosine (mg):  n/a Dose of Lexiscan: n/a mg  Dose of Atropine (mg): n/a Dose of Dobutamine: n/a mcg/kg/min (at max HR)  Stress Test Technologist: Milana Na, EMT-P  Nuclear Technologist:  Doyne Keel, CNMT     Rest Procedure:  Myocardial perfusion imaging was performed at rest 45 minutes following the intravenous administration of Technetium 49m Sestamibi. Rest ECG: NSR - Normal EKG  Stress Procedure:  The  patient exercised on the treadmill utilizing the Bruce Protocol for 3:00 minutes. The patient stopped due to fatigue,sob, and denied any chest tightness.  Technetium 33m Sestamibi was injected at peak exercise and myocardial perfusion imaging was performed after a brief delay. Stress ECG: No significant change from baseline ECG  QPS Raw Data Images: Soft tissue (diaphragm) underlies heart. Stress Images:  Mild thinning with decreased counts in the inferior wall (base, minimally mid)  Otherwise normal perfusion.  Rest Images:  Comparison with the stress images reveals no significant change. Subtraction (SDS):  No evidence of ischemia. Transient Ischemic Dilatation (Normal <1.22):  n/a Lung/Heart Ratio (Normal <0.45):  0.31  Quantitative Gated Spect Images QGS EDV:  73 ml QGS ESV:  27 ml  Impression Exercise Capacity:  Poor exercise capacity. BP Response:  Normal blood pressure response. Clinical Symptoms:  Significant chest pain. ECG Impression:  No significant ST segment change suggestive of ischemia. Comparison with Prior Nuclear Study:Previous study done '08  Overall Impression: Probable normal perfusion and minimal soft tissue attenuation (diaphragm)  No significant ischemia or scar.  LV Ejection Fraction: 63%.  LV Wall Motion:  NL LV Function; NL Wall Motion   Dietrich Pates

## 2013-07-17 NOTE — Progress Notes (Signed)
Lipid added per pt request stating its been elevated in past

## 2013-07-18 ENCOUNTER — Telehealth: Payer: Self-pay | Admitting: *Deleted

## 2013-07-18 DIAGNOSIS — E785 Hyperlipidemia, unspecified: Secondary | ICD-10-CM

## 2013-07-18 MED ORDER — ATORVASTATIN CALCIUM 40 MG PO TABS: 40.0000 mg | ORAL_TABLET | Freq: Every day | ORAL | Status: DC

## 2013-07-18 NOTE — Telephone Encounter (Signed)
Labs reviewed/ medication explained/ lab date given/ reminded her of his app this week for carotids, accepting of plan.

## 2013-07-21 ENCOUNTER — Encounter (INDEPENDENT_AMBULATORY_CARE_PROVIDER_SITE_OTHER): Payer: Medicare Other

## 2013-07-21 DIAGNOSIS — R0989 Other specified symptoms and signs involving the circulatory and respiratory systems: Secondary | ICD-10-CM

## 2013-07-21 DIAGNOSIS — Z01810 Encounter for preprocedural cardiovascular examination: Secondary | ICD-10-CM

## 2013-07-21 DIAGNOSIS — I6529 Occlusion and stenosis of unspecified carotid artery: Secondary | ICD-10-CM

## 2013-07-26 ENCOUNTER — Encounter (HOSPITAL_COMMUNITY): Payer: Medicare Other

## 2013-07-31 ENCOUNTER — Telehealth: Payer: Self-pay | Admitting: *Deleted

## 2013-07-31 ENCOUNTER — Encounter: Payer: Self-pay | Admitting: *Deleted

## 2013-07-31 NOTE — Telephone Encounter (Signed)
Message copied by Tarri Fuller on Mon Jul 31, 2013  9:29 AM ------      Message from: Prescott Gum      Created: Wed Jul 26, 2013  7:52 AM       Carotids ok. F/u in 1 year ------

## 2013-07-31 NOTE — Telephone Encounter (Signed)
Busy. We have attempted several times to reach pt about his results with the phone always busy. I will send out a results letter today to pt.

## 2013-07-31 NOTE — Telephone Encounter (Signed)
Results letter for carotids sent out today to pt.

## 2013-08-01 ENCOUNTER — Other Ambulatory Visit (HOSPITAL_COMMUNITY): Payer: Self-pay | Admitting: *Deleted

## 2013-08-02 ENCOUNTER — Encounter (HOSPITAL_COMMUNITY): Payer: Medicare Other

## 2013-08-04 ENCOUNTER — Encounter (HOSPITAL_COMMUNITY)
Admission: RE | Admit: 2013-08-04 | Discharge: 2013-08-04 | Disposition: A | Discharge status: Home / Self Care | Payer: Medicare Other | Source: Ambulatory Visit | Attending: Nephrology | Admitting: Nephrology

## 2013-08-04 DIAGNOSIS — D509 Iron deficiency anemia, unspecified: Secondary | ICD-10-CM | POA: Insufficient documentation

## 2013-08-04 DIAGNOSIS — Q619 Cystic kidney disease, unspecified: Secondary | ICD-10-CM | POA: Insufficient documentation

## 2013-08-04 LAB — IRON AND TIBC
Saturation Ratios: 29 % (ref 20–55)
UIBC: 203 ug/dL (ref 125–400)

## 2013-08-04 LAB — FERRITIN: Ferritin: 52 ng/mL (ref 22–322)

## 2013-08-04 MED ORDER — EPOETIN ALFA 40000 UNIT/ML IJ SOLN
INTRAMUSCULAR | Status: AC
  Filled 2013-08-04: qty 1

## 2013-08-04 MED ORDER — EPOETIN ALFA 40000 UNIT/ML IJ SOLN
40000.0000 [IU] | INTRAMUSCULAR | Status: DC
  Administered 2013-08-04: 40000 [IU] via SUBCUTANEOUS

## 2013-08-09 ENCOUNTER — Telehealth: Payer: Self-pay | Admitting: *Deleted

## 2013-08-09 NOTE — Telephone Encounter (Signed)
Patient walked in needing surgical clearance sent to Dr [facility] office. Will forward to [place], RN and Dr Elease Hashimoto

## 2013-08-09 NOTE — Telephone Encounter (Signed)
Herma Carson PA-C saw pt for cardiac clearance/ stress test completed, please advise if he is cleared.

## 2013-08-11 NOTE — Telephone Encounter (Signed)
Attempted to contact pt x 2 to update him that paper work was sent/ phone just rings, no answer.

## 2013-08-11 NOTE — Telephone Encounter (Signed)
Mr. Aguila is at low risk for his upcoming procedure.

## 2013-08-11 NOTE — Telephone Encounter (Signed)
I will forward this to Dr Kinnie Scales.

## 2013-08-17 ENCOUNTER — Other Ambulatory Visit (HOSPITAL_COMMUNITY): Payer: Self-pay

## 2013-08-18 ENCOUNTER — Encounter (HOSPITAL_COMMUNITY)
Admission: RE | Admit: 2013-08-18 | Discharge: 2013-08-18 | Disposition: A | Discharge status: Home / Self Care | Payer: Medicare Other | Source: Ambulatory Visit | Attending: Nephrology | Admitting: Nephrology

## 2013-08-25 ENCOUNTER — Encounter (HOSPITAL_COMMUNITY)
Admission: RE | Admit: 2013-08-25 | Discharge: 2013-08-25 | Disposition: A | Discharge status: Home / Self Care | Payer: Medicare Other | Source: Ambulatory Visit | Attending: Nephrology | Admitting: Nephrology

## 2013-08-25 DIAGNOSIS — D509 Iron deficiency anemia, unspecified: Secondary | ICD-10-CM | POA: Insufficient documentation

## 2013-08-25 DIAGNOSIS — Q619 Cystic kidney disease, unspecified: Secondary | ICD-10-CM | POA: Insufficient documentation

## 2013-08-25 LAB — POCT HEMOGLOBIN-HEMACUE: Hemoglobin: 13.1 g/dL (ref 13.0–17.0)

## 2013-08-25 MED ORDER — EPOETIN ALFA 40000 UNIT/ML IJ SOLN: 40000.0000 [IU] | INTRAMUSCULAR | Status: DC

## 2013-09-08 ENCOUNTER — Encounter (HOSPITAL_COMMUNITY): Payer: Medicare Other

## 2013-09-20 ENCOUNTER — Encounter (HOSPITAL_COMMUNITY)
Admission: RE | Admit: 2013-09-20 | Discharge: 2013-09-20 | Disposition: A | Discharge status: Home / Self Care | Payer: Medicare Other | Source: Ambulatory Visit | Attending: Nephrology | Admitting: Nephrology

## 2013-09-20 DIAGNOSIS — Q619 Cystic kidney disease, unspecified: Secondary | ICD-10-CM | POA: Insufficient documentation

## 2013-09-20 DIAGNOSIS — D509 Iron deficiency anemia, unspecified: Secondary | ICD-10-CM | POA: Insufficient documentation

## 2013-09-20 LAB — IRON AND TIBC
TIBC: 285 ug/dL (ref 215–435)
UIBC: 196 ug/dL (ref 125–400)

## 2013-09-20 LAB — POCT HEMOGLOBIN-HEMACUE: Hemoglobin: 13 g/dL (ref 13.0–17.0)

## 2013-09-20 MED ORDER — EPOETIN ALFA 40000 UNIT/ML IJ SOLN: 40000.0000 [IU] | INTRAMUSCULAR | Status: DC

## 2013-10-06 ENCOUNTER — Encounter (HOSPITAL_COMMUNITY)
Admission: RE | Admit: 2013-10-06 | Discharge: 2013-10-06 | Disposition: A | Discharge status: Home / Self Care | Payer: Medicare Other | Source: Ambulatory Visit | Attending: Nephrology | Admitting: Nephrology

## 2013-10-06 LAB — POCT HEMOGLOBIN-HEMACUE: Hemoglobin: 12.4 g/dL — ABNORMAL LOW (ref 13.0–17.0)

## 2013-10-06 MED ORDER — EPOETIN ALFA 40000 UNIT/ML IJ SOLN: 40000.0000 [IU] | INTRAMUSCULAR | Status: DC

## 2013-10-18 ENCOUNTER — Other Ambulatory Visit: Payer: Medicare Other

## 2013-10-20 ENCOUNTER — Encounter (HOSPITAL_COMMUNITY): Payer: Medicare Other

## 2013-11-01 ENCOUNTER — Telehealth: Payer: Self-pay | Admitting: *Deleted

## 2013-11-01 NOTE — Telephone Encounter (Signed)
Pt missed fasting lab app/ needs due to start of lipitor.  No answer at provided number/ will attempt later.

## 2013-11-03 ENCOUNTER — Encounter (HOSPITAL_COMMUNITY)
Admission: RE | Admit: 2013-11-03 | Discharge: 2013-11-03 | Disposition: A | Discharge status: Home / Self Care | Payer: Medicare Other | Source: Ambulatory Visit | Attending: Nephrology | Admitting: Nephrology

## 2013-11-03 DIAGNOSIS — Q619 Cystic kidney disease, unspecified: Secondary | ICD-10-CM | POA: Insufficient documentation

## 2013-11-03 DIAGNOSIS — D509 Iron deficiency anemia, unspecified: Secondary | ICD-10-CM | POA: Insufficient documentation

## 2013-11-03 LAB — IRON AND TIBC
Iron: 106 ug/dL (ref 42–135)
Saturation Ratios: 37 % (ref 20–55)
UIBC: 181 ug/dL (ref 125–400)

## 2013-11-03 LAB — FERRITIN: Ferritin: 57 ng/mL (ref 22–322)

## 2013-11-03 MED ORDER — EPOETIN ALFA 40000 UNIT/ML IJ SOLN: 40000.0000 [IU] | INTRAMUSCULAR | Status: DC

## 2013-11-07 NOTE — Telephone Encounter (Signed)
Pt was called and will come tomorrow for fasting labs.

## 2013-11-08 ENCOUNTER — Other Ambulatory Visit (INDEPENDENT_AMBULATORY_CARE_PROVIDER_SITE_OTHER): Payer: Medicare Other

## 2013-11-08 DIAGNOSIS — E785 Hyperlipidemia, unspecified: Secondary | ICD-10-CM

## 2013-11-08 LAB — BASIC METABOLIC PANEL
BUN: 27 mg/dL — ABNORMAL HIGH (ref 6–23)
CO2: 26 mEq/L (ref 19–32)
Chloride: 104 mEq/L (ref 96–112)
GFR: 49.6 mL/min — ABNORMAL LOW (ref >60.00)
Glucose, Bld: 88 mg/dL (ref 70–99)
Potassium: 4.2 mEq/L (ref 3.5–5.1)
Sodium: 136 mEq/L (ref 135–145)

## 2013-11-08 LAB — HEPATIC FUNCTION PANEL
Alkaline Phosphatase: 56 U/L (ref 39–117)
Bilirubin, Direct: 0.1 mg/dL (ref 0.0–0.3)
Total Bilirubin: 0.9 mg/dL (ref 0.3–1.2)
Total Protein: 7.4 g/dL (ref 6.0–8.3)

## 2013-11-08 LAB — LIPID PANEL
Cholesterol: 290 mg/dL — ABNORMAL HIGH (ref 0–200)
HDL: 42.4 mg/dL (ref >39.00)
VLDL: 31.8 mg/dL (ref 0.0–40.0)

## 2013-11-13 NOTE — Progress Notes (Signed)
Quick Note:  Patient and wife notified of lab results. Discussed elevated Cholesterol and need to be on medication. States he is on Lipitor. 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 Discussed heart healthy diet. Advised patient has not been seen in appointment in years, per Dr. Elease Hashimoto, so advised patient should make appointment. Appointment made for December 1st at 9:45 am. Patient will bring medications (or medication list) in for visit to verify current medications. ______

## 2013-11-17 ENCOUNTER — Encounter (HOSPITAL_COMMUNITY): Payer: Medicare Other

## 2013-11-20 ENCOUNTER — Ambulatory Visit (INDEPENDENT_AMBULATORY_CARE_PROVIDER_SITE_OTHER): Payer: Medicare Other | Admitting: Cardiovascular Disease

## 2013-11-20 ENCOUNTER — Encounter: Payer: Self-pay | Admitting: Cardiovascular Disease

## 2013-11-20 VITALS — BP 140/60 | HR 72 | Ht 66.0 in | Wt 161.0 lb

## 2013-11-20 DIAGNOSIS — E785 Hyperlipidemia, unspecified: Secondary | ICD-10-CM

## 2013-11-20 DIAGNOSIS — R0989 Other specified symptoms and signs involving the circulatory and respiratory systems: Secondary | ICD-10-CM

## 2013-11-20 NOTE — Patient Instructions (Signed)
Your physician wants you to follow-up in: 6 months  You will receive a reminder letter in the mail two months in advance. If you don't receive a letter, please call our office to schedule the follow-up appointment.   Your physician recommends that you continue on your current medications as directed. Please refer to the Current Medication list given to you today.   Your physician recommends that you return for a FASTING lipid profile: 6 months   

## 2013-11-20 NOTE — Assessment & Plan Note (Signed)
Stable,  Difficult to hear today over his breathing.

## 2013-11-20 NOTE — Assessment & Plan Note (Signed)
He eats a very high fat diet.  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  Will have him limit his intake of fats and grease.  Will recheck his lipids in 6 months.    Otherwise, he seems to be doing well.   Will see him in 6 months.

## 2013-11-20 NOTE — Progress Notes (Signed)
     Jake Griffin Date of Birth  03-15-1927       [name]    [place] [address]  [address] [place], [place]  [address]   [place], [place]  [address] [phone]     [phone]   Fax  [phone]    Fax [phone]  Problem List: 1. hypertension 2. Right bundle branch block 3. Right carotid bruit 4. Chronic renal insufficiency 5. Hyperlipidemia   History of Present Illness:  I last saw Jake Griffin 2 years ago. He was seen by [name] in July.  He had a relatively normal Myoview in July. He has had dilatation of esophageal strictures.  He remains active.  [place],  [place], stays busy chopping wood.  No CP or dyspnea.  He was found to have elevated lipids and was referred here.  He still fries his eggs in bacon grease.    Current Outpatient Prescriptions on File Prior to Visit  Medication Sig Dispense Refill  . atorvastatin (LIPITOR) 40 MG tablet Take 1 tablet (40 mg total) by mouth daily.  30 tablet  4  . telmisartan-hydrochlorothiazide (MICARDIS HCT) 80-12.5 MG per tablet Take 1 tablet by mouth daily.       No current facility-administered medications on file prior to visit.    No Known Allergies  Past Medical History  Diagnosis Date  . HTN (hypertension)   . Right carotid bruit   . Hypercholesteremia   . RBBB   . GERD (gastroesophageal reflux disease)   . Iron deficiency anemia     Past Surgical History  Procedure Laterality Date  . Inguinal hernia repair    . Lumbar spine surgery      x2  . Hemorroidectomy      History  Smoking status  . Former Smoker -- 0.50 packs/day for 5 years  . Quit date: 12/21/1961  Smokeless tobacco  . Not on file    History  Alcohol Use No    Family History  Problem Relation Age of Onset  . Stroke    . Diabetes    . Aneurysm    . Breast cancer    . Hyperlipidemia      Reviw of Systems:  Reviewed in the HPI.  All other systems are  negative.  Physical Exam: Blood pressure 140/60, pulse 72, height 5\' 6"  (1.676 m), weight 161 lb (73.029 kg). General: Well developed, well nourished, in no acute distress.  Head: Normocephalic, atraumatic, sclera non-icteric, mucus membranes are moist,   Neck: Supple. Carotids are 2 + without bruits. No JVD   Lungs: Clear   Heart: RR, normal S1, S2  Abdomen: Soft, non-tender, non-distended with normal bowel sounds.  Msk:  Strength and tone are normal   Extremities: No clubbing or cyanosis. No edema.  Distal pedal pulses are 2+ and equal    Neuro: CN II - XII intact.  Alert and oriented X 3.  Psych:  Normal   ECG:   Assessment / Plan:

## 2013-11-30 ENCOUNTER — Other Ambulatory Visit (HOSPITAL_COMMUNITY): Payer: Self-pay | Admitting: *Deleted

## 2013-12-01 ENCOUNTER — Encounter (HOSPITAL_COMMUNITY)
Admission: RE | Admit: 2013-12-01 | Discharge: 2013-12-01 | Disposition: A | Discharge status: Home / Self Care | Payer: Medicare Other | Source: Ambulatory Visit | Attending: Nephrology | Admitting: Nephrology

## 2013-12-01 DIAGNOSIS — Q619 Cystic kidney disease, unspecified: Secondary | ICD-10-CM | POA: Insufficient documentation

## 2013-12-01 DIAGNOSIS — D509 Iron deficiency anemia, unspecified: Secondary | ICD-10-CM | POA: Insufficient documentation

## 2013-12-01 LAB — POCT HEMOGLOBIN-HEMACUE: Hemoglobin: 12.2 g/dL — ABNORMAL LOW (ref 13.0–17.0)

## 2013-12-01 LAB — IRON AND TIBC
Iron: 83 ug/dL (ref 42–135)
Saturation Ratios: 28 % (ref 20–55)
TIBC: 299 ug/dL (ref 215–435)
UIBC: 216 ug/dL (ref 125–400)

## 2013-12-01 LAB — FERRITIN: Ferritin: 26 ng/mL (ref 22–322)

## 2013-12-01 MED ORDER — EPOETIN ALFA 40000 UNIT/ML IJ SOLN: 40000.0000 [IU] | INTRAMUSCULAR | Status: DC

## 2013-12-15 ENCOUNTER — Encounter (HOSPITAL_COMMUNITY): Payer: Medicare Other

## 2013-12-29 ENCOUNTER — Encounter (HOSPITAL_COMMUNITY)
Admission: RE | Admit: 2013-12-29 | Discharge: 2013-12-29 | Disposition: A | Discharge status: Home / Self Care | Payer: Medicare Other | Source: Ambulatory Visit | Attending: Nephrology | Admitting: Nephrology

## 2013-12-29 DIAGNOSIS — D509 Iron deficiency anemia, unspecified: Secondary | ICD-10-CM | POA: Insufficient documentation

## 2013-12-29 DIAGNOSIS — Q619 Cystic kidney disease, unspecified: Secondary | ICD-10-CM | POA: Insufficient documentation

## 2013-12-29 MED ORDER — EPOETIN ALFA 40000 UNIT/ML IJ SOLN: 40000.0000 [IU] | INTRAMUSCULAR | Status: DC

## 2014-01-01 LAB — POCT HEMOGLOBIN-HEMACUE: Hemoglobin: 13.2 g/dL (ref 13.0–17.0)

## 2014-01-12 ENCOUNTER — Encounter (HOSPITAL_COMMUNITY)
Admission: RE | Admit: 2014-01-12 | Discharge: 2014-01-12 | Disposition: A | Discharge status: Home / Self Care | Payer: Medicare Other | Source: Ambulatory Visit | Attending: Nephrology | Admitting: Nephrology

## 2014-01-12 LAB — FERRITIN: FERRITIN: 20 ng/mL — AB (ref 22–322)

## 2014-01-12 LAB — IRON AND TIBC
Iron: 46 ug/dL (ref 42–135)
SATURATION RATIOS: 15 % — AB (ref 20–55)
TIBC: 314 ug/dL (ref 215–435)
UIBC: 268 ug/dL (ref 125–400)

## 2014-01-12 LAB — POCT HEMOGLOBIN-HEMACUE: HEMOGLOBIN: 12.4 g/dL — AB (ref 13.0–17.0)

## 2014-01-12 MED ORDER — EPOETIN ALFA 40000 UNIT/ML IJ SOLN: 40000.0000 [IU] | INTRAMUSCULAR | Status: DC

## 2014-01-26 ENCOUNTER — Ambulatory Visit (HOSPITAL_COMMUNITY)
Admission: RE | Admit: 2014-01-26 | Discharge: 2014-01-26 | Disposition: A | Discharge status: Home / Self Care | Payer: Medicare Other | Source: Ambulatory Visit | Attending: Internal Medicine | Admitting: Internal Medicine

## 2014-01-26 ENCOUNTER — Other Ambulatory Visit (HOSPITAL_COMMUNITY): Payer: Self-pay | Admitting: Internal Medicine

## 2014-01-26 ENCOUNTER — Encounter (HOSPITAL_COMMUNITY): Payer: Medicare Other

## 2014-01-26 DIAGNOSIS — R05 Cough: Secondary | ICD-10-CM

## 2014-01-26 DIAGNOSIS — R0602 Shortness of breath: Secondary | ICD-10-CM | POA: Insufficient documentation

## 2014-01-26 DIAGNOSIS — Z87891 Personal history of nicotine dependence: Secondary | ICD-10-CM | POA: Insufficient documentation

## 2014-01-26 DIAGNOSIS — J4489 Other specified chronic obstructive pulmonary disease: Secondary | ICD-10-CM | POA: Insufficient documentation

## 2014-01-26 DIAGNOSIS — I10 Essential (primary) hypertension: Secondary | ICD-10-CM | POA: Insufficient documentation

## 2014-01-26 DIAGNOSIS — R059 Cough, unspecified: Secondary | ICD-10-CM

## 2014-01-26 DIAGNOSIS — J449 Chronic obstructive pulmonary disease, unspecified: Secondary | ICD-10-CM | POA: Insufficient documentation

## 2014-01-29 ENCOUNTER — Other Ambulatory Visit (HOSPITAL_COMMUNITY): Payer: Self-pay | Admitting: Internal Medicine

## 2014-01-29 DIAGNOSIS — R911 Solitary pulmonary nodule: Secondary | ICD-10-CM

## 2014-01-31 ENCOUNTER — Encounter (HOSPITAL_COMMUNITY): Payer: Medicare Other

## 2014-01-31 ENCOUNTER — Ambulatory Visit (HOSPITAL_COMMUNITY)
Admission: RE | Admit: 2014-01-31 | Discharge: 2014-01-31 | Disposition: A | Discharge status: Home / Self Care | Payer: Medicare Other | Source: Ambulatory Visit | Attending: Internal Medicine | Admitting: Internal Medicine

## 2014-01-31 DIAGNOSIS — R918 Other nonspecific abnormal finding of lung field: Secondary | ICD-10-CM | POA: Insufficient documentation

## 2014-01-31 DIAGNOSIS — I2584 Coronary atherosclerosis due to calcified coronary lesion: Secondary | ICD-10-CM | POA: Insufficient documentation

## 2014-01-31 DIAGNOSIS — R05 Cough: Secondary | ICD-10-CM | POA: Insufficient documentation

## 2014-01-31 DIAGNOSIS — R059 Cough, unspecified: Secondary | ICD-10-CM | POA: Insufficient documentation

## 2014-01-31 DIAGNOSIS — R911 Solitary pulmonary nodule: Secondary | ICD-10-CM

## 2014-01-31 DIAGNOSIS — E0789 Other specified disorders of thyroid: Secondary | ICD-10-CM | POA: Insufficient documentation

## 2014-02-01 ENCOUNTER — Other Ambulatory Visit (HOSPITAL_COMMUNITY): Payer: Self-pay | Admitting: *Deleted

## 2014-02-02 ENCOUNTER — Encounter (HOSPITAL_COMMUNITY)
Admission: RE | Admit: 2014-02-02 | Discharge: 2014-02-02 | Disposition: A | Discharge status: Home / Self Care | Payer: Medicare Other | Source: Ambulatory Visit | Attending: Nephrology | Admitting: Nephrology

## 2014-02-02 DIAGNOSIS — D509 Iron deficiency anemia, unspecified: Secondary | ICD-10-CM | POA: Insufficient documentation

## 2014-02-02 DIAGNOSIS — Q619 Cystic kidney disease, unspecified: Secondary | ICD-10-CM | POA: Insufficient documentation

## 2014-02-02 MED ORDER — SODIUM CHLORIDE 0.9 % IV SOLN
INTRAVENOUS | Status: DC
  Administered 2014-02-02: 250 mL via INTRAVENOUS

## 2014-02-02 MED ORDER — FERUMOXYTOL INJECTION 510 MG/17 ML
510.0000 mg | Freq: Once | INTRAVENOUS | Status: AC
  Administered 2014-02-02: 510 mg via INTRAVENOUS

## 2014-02-02 MED ORDER — EPOETIN ALFA 40000 UNIT/ML IJ SOLN: 40000.0000 [IU] | INTRAMUSCULAR | Status: DC

## 2014-02-02 MED ORDER — FERUMOXYTOL INJECTION 510 MG/17 ML
INTRAVENOUS | Status: AC
  Administered 2014-02-02: 510 mg via INTRAVENOUS
  Filled 2014-02-02: qty 17

## 2014-02-05 LAB — POCT HEMOGLOBIN-HEMACUE: HEMOGLOBIN: 12.7 g/dL — AB (ref 13.0–17.0)

## 2014-02-21 ENCOUNTER — Other Ambulatory Visit (HOSPITAL_COMMUNITY): Payer: Self-pay | Admitting: "Endocrinology

## 2014-02-21 DIAGNOSIS — E042 Nontoxic multinodular goiter: Secondary | ICD-10-CM

## 2014-02-27 ENCOUNTER — Ambulatory Visit (HOSPITAL_COMMUNITY): Payer: Medicare Other

## 2014-02-28 ENCOUNTER — Ambulatory Visit (HOSPITAL_COMMUNITY)
Admission: RE | Admit: 2014-02-28 | Discharge: 2014-02-28 | Disposition: A | Discharge status: Home / Self Care | Payer: Medicare Other | Source: Ambulatory Visit | Attending: "Endocrinology | Admitting: "Endocrinology

## 2014-02-28 DIAGNOSIS — E042 Nontoxic multinodular goiter: Secondary | ICD-10-CM | POA: Insufficient documentation

## 2014-03-26 LAB — HM COLONOSCOPY: HM Colonoscopy: NORMAL

## 2014-08-26 ENCOUNTER — Emergency Department (HOSPITAL_COMMUNITY)
Admission: EM | Admit: 2014-08-26 | Discharge: 2014-08-26 | Disposition: A | Discharge status: Home / Self Care | Payer: Medicare Other | Attending: Emergency Medicine | Admitting: Emergency Medicine

## 2014-08-26 ENCOUNTER — Emergency Department (HOSPITAL_COMMUNITY): Payer: Medicare Other

## 2014-08-26 ENCOUNTER — Encounter (HOSPITAL_COMMUNITY): Payer: Self-pay | Admitting: Emergency Medicine

## 2014-08-26 DIAGNOSIS — M542 Cervicalgia: Secondary | ICD-10-CM | POA: Diagnosis present

## 2014-08-26 DIAGNOSIS — I10 Essential (primary) hypertension: Secondary | ICD-10-CM | POA: Insufficient documentation

## 2014-08-26 DIAGNOSIS — H9209 Otalgia, unspecified ear: Secondary | ICD-10-CM | POA: Insufficient documentation

## 2014-08-26 DIAGNOSIS — R51 Headache: Secondary | ICD-10-CM | POA: Insufficient documentation

## 2014-08-26 DIAGNOSIS — Z79899 Other long term (current) drug therapy: Secondary | ICD-10-CM | POA: Insufficient documentation

## 2014-08-26 DIAGNOSIS — Z862 Personal history of diseases of the blood and blood-forming organs and certain disorders involving the immune mechanism: Secondary | ICD-10-CM | POA: Insufficient documentation

## 2014-08-26 DIAGNOSIS — R519 Headache, unspecified: Secondary | ICD-10-CM

## 2014-08-26 DIAGNOSIS — Z8719 Personal history of other diseases of the digestive system: Secondary | ICD-10-CM | POA: Diagnosis not present

## 2014-08-26 DIAGNOSIS — Z87891 Personal history of nicotine dependence: Secondary | ICD-10-CM | POA: Diagnosis not present

## 2014-08-26 DIAGNOSIS — E7800 Pure hypercholesterolemia, unspecified: Secondary | ICD-10-CM | POA: Diagnosis not present

## 2014-08-26 MED ORDER — ACETAMINOPHEN 500 MG PO TABS: 500.0000 mg | ORAL_TABLET | Freq: Four times a day (QID) | ORAL | Status: DC | PRN

## 2014-08-26 MED ORDER — ACETAMINOPHEN 325 MG PO TABS
650.0000 mg | ORAL_TABLET | Freq: Once | ORAL | Status: AC
  Administered 2014-08-26: 650 mg via ORAL
  Filled 2014-08-26: qty 2

## 2014-08-26 NOTE — ED Notes (Signed)
Pt c/o left earache and neck pain for past few weeks.

## 2014-08-26 NOTE — ED Provider Notes (Signed)
CSN: [id number]     Arrival date & time 08/26/14  1557 History   First MD Initiated Contact with Patient 08/26/14 1618     Chief Complaint  Patient presents with  . Otalgia  . Neck Pain     (Consider location/radiation/quality/duration/timing/severity/associated sxs/prior Treatment) HPIPatient presents with concern of pain about the left occiput, mastoid, ear. Time of onset is unclear, but it seemed to have been at least one week ago. Patient has no hearing in the ear. No clear precipitant. Since onset has been sharp pain in the aforementioned distribution.  There is no concurrent fevers, chills, headache, visual loss. There is no asymmetric weakness, no falling or disequilibrium. Patient also has very poor hearing in his right ear, augmented with hearing aid.  Past Medical History  Diagnosis Date  . HTN (hypertension)   . Right carotid bruit   . Hypercholesteremia   . RBBB   . GERD (gastroesophageal reflux disease)   . Iron deficiency anemia    Past Surgical History  Procedure Laterality Date  . Inguinal hernia repair    . Lumbar spine surgery      x2  . Hemorroidectomy     Family History  Problem Relation Age of Onset  . Stroke    . Diabetes    . Aneurysm    . Breast cancer    . Hyperlipidemia     History  Substance Use Topics  . Smoking status: Former Smoker -- 0.50 packs/day for 5 years    Quit date: 12/21/1961  . Smokeless tobacco: Not on file  . Alcohol Use: No    Review of Systems  Constitutional:       Per HPI, otherwise negative  HENT:       Per HPI, otherwise negative  Respiratory:       Per HPI, otherwise negative  Cardiovascular:       Per HPI, otherwise negative  Gastrointestinal: Negative for vomiting.  Endocrine:       Negative aside from HPI  Genitourinary:       Neg aside from HPI   Musculoskeletal:       Per HPI, otherwise negative  Skin: Negative.   Neurological: Negative for syncope.      Allergies  Review of patient's  allergies indicates no known allergies.  Home Medications   Prior to Admission medications   Medication Sig Start Date End Date Taking? Authorizing Provider  acetaminophen (TYLENOL) 500 MG tablet Take 500 mg by mouth every 6 (six) hours as needed for mild pain or moderate pain.   Yes Historical Provider, MD  ALPRAZolam Prudy Feeler) 0.5 MG tablet Take 1 tablet by mouth at bedtime.  07/24/14  Yes Historical Provider, MD  atorvastatin (LIPITOR) 40 MG tablet Take 40 mg by mouth every evening.   Yes Historical Provider, MD  UNKNOWN TO PATIENT Take 1 tablet by mouth daily.   Yes Historical Provider, MD  UNKNOWN TO PATIENT Take 1 tablet by mouth daily.   Yes Historical Provider, MD   BP 175/101  Pulse 75  Temp(Src) 98.2 F (36.8 C) (Oral)  Resp 20  Ht  (1.676 m)  Wt 160 lb (72.576 kg)  BMI 25.84 kg/m2  SpO2 99% Physical Exam  Nursing note and vitals reviewed. Constitutional: He is oriented to person, place, and time. He appears well-developed. No distress.  HENT:  Head: Normocephalic and atraumatic.    Left Ear: Tympanic membrane normal. No lacerations. There is tenderness. No drainage or swelling. There is mastoid  tenderness.  No middle ear effusion. Decreased hearing is noted.  Nose: Nose normal.  Mouth/Throat: Uvula is midline.  Eyes: Conjunctivae and EOM are normal.  Cardiovascular: Normal rate and regular rhythm.   Pulmonary/Chest: Effort normal. No stridor. No respiratory distress.  Abdominal: He exhibits no distension.  Musculoskeletal: He exhibits no edema.  Neurological: He is alert and oriented to person, place, and time.  Skin: Skin is warm and dry.  Psychiatric: He has a normal mood and affect.    ED Course  Procedures (including critical care time)  Imaging Review Ct Head Wo Contrast  08/26/2014   CLINICAL DATA:  Left-sided headache and pain in the region of the left mastoid and occipital bone.  EXAM: CT HEAD WITHOUT CONTRAST  TECHNIQUE: Contiguous axial images were  obtained from the base of the skull through the vertex without intravenous contrast.  COMPARISON:  06/26/2004  FINDINGS: The brain demonstrates no evidence of hemorrhage, infarction, edema, mass effect, extra-axial fluid collection, hydrocephalus or mass lesion. The skull is unremarkable. Mastoid air cells are normally aerated bilaterally. Visualized paranasal sinuses are unremarkable. No bony lesions or destruction identified.  IMPRESSION: Normal head CT.   Electronically Signed   By: Irish Lack M.D.   On: 08/26/2014 17:53   A review of the patient's chart demonstrates that he had CT chest with thyroid abnormality, and subsequent ultrasound of his thyroid demonstrated a possible nontoxic mass. No report of the biopsy thus far.  6:42 PM Exam the patient is standing upright, in no distress, moving freely. I discussed all findings with him and his family member. Patient has a primary care physician with annual followup. Specifically we discussed the need for additional evaluation of his recent abnormal ultrasound, thyroid.   MDM   Final diagnoses:  Acute nonintractable headache, unspecified headache type    Patient presents with ongoing left earache, head pain, neck pain, focally about the mastoid. No evidence for mastoiditis, nor infection. No neurologic deficits suggestive of CNS dysfunction. Patient's CT scan was unremarkable. Patient does have a recent ultrasound that was abnormal, and  this warrants further evaluation with primary care. Absent distress, neurologic dysfunction, abnormal vital signs, patient was appropriate for discharge with outpatient followup.    Gerhard Munch, MD 08/26/14 [phone]

## 2014-08-26 NOTE — Discharge Instructions (Signed)
As discussed, your evaluation today has been largely reassuring.  With your ongoing pain, it is important that you followup with your primary care physician for appropriate ongoing evaluation. Please be sure to discuss your recent thyroid studies, and need for additional evaluation.  Please use Tylenol, ice packs for pain control.  Return here or concerning changes in your condition.

## 2014-09-11 ENCOUNTER — Other Ambulatory Visit (HOSPITAL_COMMUNITY): Payer: Self-pay | Admitting: Internal Medicine

## 2014-09-11 DIAGNOSIS — S060X9A Concussion with loss of consciousness of unspecified duration, initial encounter: Secondary | ICD-10-CM

## 2014-09-11 DIAGNOSIS — S060XAA Concussion with loss of consciousness status unknown, initial encounter: Secondary | ICD-10-CM

## 2014-09-11 DIAGNOSIS — M542 Cervicalgia: Secondary | ICD-10-CM

## 2014-09-12 ENCOUNTER — Ambulatory Visit (HOSPITAL_COMMUNITY)
Admission: RE | Admit: 2014-09-12 | Discharge: 2014-09-12 | Disposition: A | Discharge status: Home / Self Care | Payer: Medicare Other | Source: Ambulatory Visit | Attending: Internal Medicine | Admitting: Internal Medicine

## 2014-09-12 DIAGNOSIS — M47812 Spondylosis without myelopathy or radiculopathy, cervical region: Secondary | ICD-10-CM | POA: Insufficient documentation

## 2014-09-12 DIAGNOSIS — W19XXXA Unspecified fall, initial encounter: Secondary | ICD-10-CM | POA: Insufficient documentation

## 2014-09-12 DIAGNOSIS — S060X0A Concussion without loss of consciousness, initial encounter: Secondary | ICD-10-CM | POA: Insufficient documentation

## 2014-09-12 DIAGNOSIS — S060X9A Concussion with loss of consciousness of unspecified duration, initial encounter: Secondary | ICD-10-CM | POA: Diagnosis not present

## 2014-09-12 DIAGNOSIS — S060XAA Concussion with loss of consciousness status unknown, initial encounter: Secondary | ICD-10-CM

## 2014-09-12 DIAGNOSIS — R51 Headache: Secondary | ICD-10-CM | POA: Insufficient documentation

## 2014-09-12 DIAGNOSIS — H9209 Otalgia, unspecified ear: Secondary | ICD-10-CM | POA: Insufficient documentation

## 2014-09-12 DIAGNOSIS — M5030 Other cervical disc degeneration, unspecified cervical region: Secondary | ICD-10-CM | POA: Insufficient documentation

## 2014-09-12 DIAGNOSIS — M542 Cervicalgia: Secondary | ICD-10-CM | POA: Diagnosis present

## 2014-10-03 ENCOUNTER — Encounter: Payer: Self-pay | Admitting: Diagnostic Neuroimaging

## 2014-10-03 ENCOUNTER — Ambulatory Visit (INDEPENDENT_AMBULATORY_CARE_PROVIDER_SITE_OTHER): Payer: Medicare Other | Admitting: Diagnostic Neuroimaging

## 2014-10-03 VITALS — BP 144/67 | HR 100 | Temp 97.0°F | Ht 66.0 in | Wt 147.0 lb

## 2014-10-03 DIAGNOSIS — F03B0 Unspecified dementia, moderate, without behavioral disturbance, psychotic disturbance, mood disturbance, and anxiety: Secondary | ICD-10-CM

## 2014-10-03 DIAGNOSIS — F0390 Unspecified dementia without behavioral disturbance: Secondary | ICD-10-CM

## 2014-10-03 DIAGNOSIS — F0781 Postconcussional syndrome: Secondary | ICD-10-CM

## 2014-10-03 DIAGNOSIS — R269 Unspecified abnormalities of gait and mobility: Secondary | ICD-10-CM

## 2014-10-03 DIAGNOSIS — M545 Low back pain: Secondary | ICD-10-CM

## 2014-10-03 NOTE — Progress Notes (Signed)
[facility]  PATIENT: Jake Griffin DOB: February 17, 1927  REFERRING CLINICIAN: jackson HISTORY FROM: patient and son REASON FOR VISIT: new consult    HISTORICAL  CHIEF COMPLAINT:  Chief Complaint  Patient presents with  . Altered Mental Status    HISTORY OF PRESENT ILLNESS:   80 year old male here for alteration of memory loss and confusion. Patient accompanied by son. For past 5-6 years patient has had increasing short-term memory problems, repeating himself, slowing down generally. One month ago patient had an episode where he struck his head while standing up quickly near the refrigerator, and striking the freezer door which had been left open. Patient had immediate pain, confusion, unsteadiness. Confusion and unsteadiness lasted for several weeks.  Patient also has had progressive stooped posture, short small steps. No hallucinations. No wandering or getting lost. Patient is primary caregiver for his wife who has end-stage renal disease, degenerative joint disease. Patient still drives a car, shops, cooks, cleans and cares for his wife. Lately this is been getting more and more difficult. Patient and his son have been discussing other options such as assisted living. His wife is ready to move to assisted living however patient is still somewhat rotated.    REVIEW OF SYSTEMS: Full 14 system review of systems performed and notable only for weight loss hearing loss ringing in ears constipation urination problems blood in urine incont feeling cold anxiety dizziness weakness headache confusion memory loss.  ALLERGIES: No Known Allergies  HOME MEDICATIONS: Outpatient Prescriptions Prior to Visit  Medication Sig Dispense Refill  . acetaminophen (TYLENOL) 500 MG tablet Take 1 tablet (500 mg total) by mouth every 6 (six) hours as needed for mild pain or moderate pain.  30 tablet  0  . ALPRAZolam (XANAX) 0.5 MG tablet Take 1 tablet by mouth at bedtime as needed.       Marland Kitchen  atorvastatin (LIPITOR) 40 MG tablet Take 40 mg by mouth every evening.      Marland Kitchen UNKNOWN TO PATIENT Take 1 tablet by mouth daily.      Marland Kitchen UNKNOWN TO PATIENT Take 1 tablet by mouth daily.       No facility-administered medications prior to visit.    PAST MEDICAL HISTORY: Past Medical History  Diagnosis Date  . HTN (hypertension)   . Right carotid bruit   . Hypercholesteremia   . RBBB   . GERD (gastroesophageal reflux disease)   . Iron deficiency anemia     PAST SURGICAL HISTORY: Past Surgical History  Procedure Laterality Date  . Inguinal hernia repair    . Lumbar spine surgery      x2  . Hemorroidectomy      FAMILY HISTORY: Family History  Problem Relation Age of Onset  . Stroke    . Diabetes    . Aneurysm    . Breast cancer    . Hyperlipidemia    . Heart attack Mother   . Other Father     complication of surgery    SOCIAL HISTORY:  History   Social History  . Marital Status: Married    Spouse Name: Daphne    Number of Children: 1  . Years of Education: HS   Occupational History  . retired    Social History Main Topics  . Smoking status: Former Smoker -- 0.50 packs/day for 5 years    Quit date: 12/21/1961  . Smokeless tobacco: Never Used  . Alcohol Use: No  . Drug Use: No  . Sexual Activity: Not  on file   Other Topics Concern  . Not on file   Social History Narrative   Patient lives at home with spouse.   Caffeine use: 1 cup daily     PHYSICAL EXAM  Filed Vitals:   10/03/14 1024  BP: 144/67  Pulse: 100  Temp: 97 F (36.1 C)  TempSrc: Oral  Height: _0  (1.676 m)  Weight: 147 lb (66.679 kg)    Not recorded   No exam data present   Body mass index is 23.74 kg/(m^2).  GENERAL EXAM: Patient is in no distress; well developed, nourished and groomed; neck is supple  CARDIOVASCULAR: Regular rate and rhythm, no murmurs, no carotid bruits  NEUROLOGIC: MENTAL STATUS: awake, alert, oriented to person, place and time, recent and remote  memory intact, normal attention and concentration, language fluent, comprehension intact, naming intact, fund of knowledge appropriate; EXCEPT MMSE 18/30 (MISSES 1 FOR DATE, 1 FOR STATE, 1 FOR BUILDING, 4 ON WORLD BACKWARDS, 3 ON RECALL 1 FOR SENTENCE 1 FOR PENTAGONS). CANNOT DRAW CLOCK NUMBERS OR HANDS CORRECTLY. AFT 12. GDS 2. POSITIVE SNOUT, POSITIVE MYERSONS. NEG PALMOMENTAL.  CRANIAL NERVE: no papilledema on fundoscopic exam, pupils equal and reactive to light, visual fields full to confrontation, extraocular muscles intact, no nystagmus, facial sensation and strength symmetric, hearing DECREASED, palate elevates symmetrically, uvula midline, shoulder shrug symmetric, tongue midline. MOTOR: normal bulk and tone, full strength in the BUE, BLE SENSORY: normal and symmetric to light touch; ABSENT VIB IN RIGHT FOOT. COORDINATION: finger-nose-finger, fine finger movements normal; DIFFUSELY SLOW MOVEMENTS REFLEXES: BUE 1, NLE 0 GAIT/STATION: STOOPED POSTURE, SLOW SMALLS STEPS, UNSTEADY.    DIAGNOSTIC DATA (LABS, IMAGING, TESTING) - I reviewed patient records, labs, notes, testing and imaging myself where available.  Lab Results  Component Value Date   WBC 3.5* 07/17/2013   HGB 12.7* 02/02/2014   HCT 37.7* 07/17/2013   MCV 72.6* 07/17/2013   PLT 305.0 07/17/2013      Component Value Date/Time   NA 136 11/08/2013 1059   K 4.2 11/08/2013 1059   CL 104 11/08/2013 1059   CO2 26 11/08/2013 1059   GLUCOSE 88 11/08/2013 1059   BUN 27* 11/08/2013 1059   CREATININE 1.7* 11/08/2013 1059   CALCIUM 9.3 11/08/2013 1059   PROT 7.4 11/08/2013 1059   ALBUMIN 3.7 11/08/2013 1059   AST 24 11/08/2013 1059   ALT 13 11/08/2013 1059   ALKPHOS 56 11/08/2013 1059   BILITOT 0.9 11/08/2013 1059   GFRNONAA 42* 01/29/2009 1350   GFRAA  Value: 51        The eGFR has been calculated using the MDRD equation. This calculation has not been validated in all clinical situations. eGFR's persistently <60 mL/min signify  possible Chronic Kidney Disease.* 01/29/2009 1350   Lab Results  Component Value Date   CHOL 290* 11/08/2013   HDL 42.40 11/08/2013   LDLDIRECT 196.9 11/08/2013   TRIG 159.0* 11/08/2013   CHOLHDL 7 11/08/2013   No results found for this basename: HGBA1C   No results found for this basename: VITAMINB12   Lab Results  Component Value Date   TSH 1.23 07/17/2013    I reviewed images myself and agree with interpretation. -VRP  09/12/14 CT HEAD - moderate perisylvian atrophy; no acute findings   ASSESSMENT AND PLAN  80 y.o. year old male here with short term memory loss, psychomotor slowing, status post concussion one month ago, with acute confusion and postconcussion syndrome. MMSE 18/30 with frontal release signs. Therefore suspect underlying  moderate dementia as well.  PLAN: - MRI brain to eval for secondary causes - discussed with patient and son about home living situation and safety issues; it would be beter if patient and wife transitioned to assisted living and also if patient stopped driving. They are working with a Education officer, museum. Son and patient agree with assessment and plan. - after living situation is stabilized, then may consider donepezil and memantine  Orders Placed This Encounter  Procedures  . MR Brain Wo Contrast   Return in about 3 months (around 01/03/2015).    Penni Bombard, MD 70/92/9574, 73:40 PM Certified in Neurology, Neurophysiology and Neuroimaging  [facility] [address] [place], [place] [address] [phone]

## 2014-10-03 NOTE — Patient Instructions (Signed)
I will check MRI brain.  Please stop driving a car. It is no longer safe for you to drive.  Consider other living options (with family, friends or assisted living).

## 2014-10-08 DIAGNOSIS — F0390 Unspecified dementia without behavioral disturbance: Secondary | ICD-10-CM | POA: Insufficient documentation

## 2014-12-24 ENCOUNTER — Other Ambulatory Visit (HOSPITAL_COMMUNITY): Payer: Self-pay | Admitting: Internal Medicine

## 2014-12-24 DIAGNOSIS — M48061 Spinal stenosis, lumbar region without neurogenic claudication: Secondary | ICD-10-CM

## 2014-12-31 ENCOUNTER — Other Ambulatory Visit (HOSPITAL_COMMUNITY): Payer: Self-pay

## 2015-01-03 ENCOUNTER — Encounter: Payer: Self-pay | Admitting: Diagnostic Neuroimaging

## 2015-01-03 ENCOUNTER — Ambulatory Visit (INDEPENDENT_AMBULATORY_CARE_PROVIDER_SITE_OTHER): Payer: Medicare Other | Admitting: Diagnostic Neuroimaging

## 2015-01-03 VITALS — BP 168/74 | HR 96 | Temp 98.3°F | Ht 66.0 in | Wt 148.6 lb

## 2015-01-03 DIAGNOSIS — F0390 Unspecified dementia without behavioral disturbance: Secondary | ICD-10-CM

## 2015-01-03 DIAGNOSIS — F03B0 Unspecified dementia, moderate, without behavioral disturbance, psychotic disturbance, mood disturbance, and anxiety: Secondary | ICD-10-CM

## 2015-01-03 NOTE — Patient Instructions (Signed)
Please stop driving.   Please consider more daily supervision (nursing aid, living with family, living in assisted living).

## 2015-01-03 NOTE — Progress Notes (Signed)
[facility]  PATIENT: Jake Griffin DOB: 01/20/1927  REFERRING CLINICIAN: Glennon Mac HISTORY FROM: patient and son REASON FOR VISIT: follow up   HISTORICAL  CHIEF COMPLAINT:  Chief Complaint  Patient presents with  . Follow-up    HISTORY OF PRESENT ILLNESS:   UPDATE 01/03/15: Since last visit, memory loss has continued. Patient stoppped talking all of his meds after he ran out. His wife is now in SNF (she had stopped dialysis for 2 weeks). Patient still driving and living alone. His son checks in on him a few times per week.   PRIOR HPI (10/03/14): 81 year old male here for alteration of memory loss and confusion. Patient accompanied by son. For past 5-6 years patient has had increasing short-term memory problems, repeating himself, slowing down generally. One month ago patient had an episode where he struck his head while standing up quickly near the refrigerator, and striking the freezer door which had been left open. Patient had immediate pain, confusion, unsteadiness. Confusion and unsteadiness lasted for several weeks. Patient also has had progressive stooped posture, short small steps. No hallucinations. No wandering or getting lost. Patient is primary caregiver for his wife who has end-stage renal disease, degenerative joint disease. Patient still drives a car, shops, cooks, cleans and cares for his wife. Lately this is been getting more and more difficult. Patient and his son have been discussing other options such as assisted living. His wife is ready to move to assisted living however patient is still somewhat reluctant.   REVIEW OF SYSTEMS: Full 14 system review of systems performed and notable only for hearing loss ringing in ears constipation urination problems blood in urine incont feeling cold anxiety dizziness weakness headache confusion memory loss.  ALLERGIES: No Known Allergies  HOME MEDICATIONS: Outpatient Prescriptions Prior to Visit  Medication  Sig Dispense Refill  . acetaminophen (TYLENOL) 500 MG tablet Take 1 tablet (500 mg total) by mouth every 6 (six) hours as needed for mild pain or moderate pain. 30 tablet 0  . atorvastatin (LIPITOR) 40 MG tablet Take 40 mg by mouth every evening.    Marland Kitchen ALPRAZolam (XANAX) 0.5 MG tablet Take 1 tablet by mouth at bedtime as needed.     Marland Kitchen telmisartan-hydrochlorothiazide (MICARDIS HCT) 80-25 MG per tablet Take 1 tablet by mouth daily.     No facility-administered medications prior to visit.    PAST MEDICAL HISTORY: Past Medical History  Diagnosis Date  . HTN (hypertension)   . Right carotid bruit   . Hypercholesteremia   . RBBB   . GERD (gastroesophageal reflux disease)   . Iron deficiency anemia     PAST SURGICAL HISTORY: Past Surgical History  Procedure Laterality Date  . Inguinal hernia repair    . Lumbar spine surgery      x2  . Hemorroidectomy      FAMILY HISTORY: Family History  Problem Relation Age of Onset  . Stroke    . Diabetes    . Aneurysm    . Breast cancer    . Hyperlipidemia    . Heart attack Mother   . Other Father     complication of surgery    SOCIAL HISTORY:  History   Social History  . Marital Status: Married    Spouse Name: Daphne    Number of Children: 1  . Years of Education: HS   Occupational History  . retired    Social History Main Topics  . Smoking status: Former Smoker -- 0.50 packs/day for  5 years    Quit date: 12/21/1961  . Smokeless tobacco: Never Used  . Alcohol Use: No  . Drug Use: No  . Sexual Activity: Not on file   Other Topics Concern  . Not on file   Social History Narrative   Patient lives at home with spouse.   Caffeine use: 1 cup daily     PHYSICAL EXAM  Filed Vitals:   01/03/15 1325  BP: 168/74  Pulse: 96  Temp: 98.3 F (36.8 C)  TempSrc: Oral  Height: $Remove'5\' 6"'rbHnQHO$  (1.676 m)  Weight: 148 lb 9.6 oz (67.405 kg)    Not recorded     No exam data present  Body mass index is 24 kg/(m^2).  MMSE - Mini  Mental State Exam 01/03/2015 10/03/2014  Orientation to time 2 4  Orientation to Place 3 3  Registration 3 3  Attention/ Calculation 0 1  Recall 2 0  Language- name 2 objects 2 2  Language- repeat 0 1  Language- follow 3 step command 2 3  Language- read & follow direction 1 1  Write a sentence 0 0  Copy design 0 0  Total score 15 18     GENERAL EXAM: Patient is in no distress; well developed, nourished and groomed; neck is supple  CARDIOVASCULAR: Regular rate and rhythm, no murmurs, no carotid bruits  NEUROLOGIC: MENTAL STATUS: awake, alert, language fluent, comprehension intact; AFT 12. GDS 2. POSITIVE SNOUT, POSITIVE MYERSONS. NEG PALMOMENTAL.  CRANIAL NERVE: pupils equal and reactive to light, visual fields full to confrontation, extraocular muscles intact, no nystagmus, facial sensation and strength symmetric, hearing DECREASED, palate elevates symmetrically, uvula midline, shoulder shrug symmetric, tongue midline. MOTOR: normal bulk and tone, full strength in the BUE, BLE SENSORY: normal and symmetric to light touch; ABSENT VIB IN RIGHT FOOT. COORDINATION: finger-nose-finger, fine finger movements normal; DIFFUSELY SLOW MOVEMENTS REFLEXES: BUE 1, NLE 0 GAIT/STATION: STOOPED POSTURE, SLOW SMALLS STEPS, UNSTEADY.    DIAGNOSTIC DATA (LABS, IMAGING, TESTING) - I reviewed patient records, labs, notes, testing and imaging myself where available.  Lab Results  Component Value Date   WBC 3.5* 07/17/2013   HGB 12.7* 02/02/2014   HCT 37.7* 07/17/2013   MCV 72.6* 07/17/2013   PLT 305.0 07/17/2013      Component Value Date/Time   NA 136 11/08/2013 1059   K 4.2 11/08/2013 1059   CL 104 11/08/2013 1059   CO2 26 11/08/2013 1059   GLUCOSE 88 11/08/2013 1059   BUN 27* 11/08/2013 1059   CREATININE 1.7* 11/08/2013 1059   CALCIUM 9.3 11/08/2013 1059   PROT 7.4 11/08/2013 1059   ALBUMIN 3.7 11/08/2013 1059   AST 24 11/08/2013 1059   ALT 13 11/08/2013 1059   ALKPHOS 56  11/08/2013 1059   BILITOT 0.9 11/08/2013 1059   GFRNONAA 42* 01/29/2009 1350   GFRAA * 01/29/2009 1350    51        The eGFR has been calculated using the MDRD equation. This calculation has not been validated in all clinical situations. eGFR's persistently <60 mL/min signify possible Chronic Kidney Disease.   Lab Results  Component Value Date   CHOL 290* 11/08/2013   HDL 42.40 11/08/2013   LDLDIRECT 196.9 11/08/2013   TRIG 159.0* 11/08/2013   CHOLHDL 7 11/08/2013   No results found for: HGBA1C No results found for: VITAMINB12 Lab Results  Component Value Date   TSH 1.23 07/17/2013    I reviewed images myself and agree with interpretation. -VRP  09/12/14 CT  HEAD - moderate perisylvian atrophy; no acute findings   ASSESSMENT AND PLAN  81 y.o. year old male here with short term memory loss, psychomotor slowing, status post concussion one month ago, with acute confusion and postconcussion syndrome. MMSE 18/30 --> 15/30; with frontal release signs. Therefore suspect underlying moderate dementia.  PLAN: - discussed with patient and son about home living situation and safety issues; it would be beter if patient transitioned to assisted living and also if patient stopped driving. They are working with a Education officer, museum. Son and patient agree with assessment and plan. - not a good candidate for dementia medications given advanced age and significant symptoms and lack of supervised living situation  Return if symptoms worsen or fail to improve, for return to PCP.    Penni Bombard, MD 0/99/8338, 2:50 PM Certified in Neurology, Neurophysiology and Neuroimaging  [facility] [address], [place], [place] [address] [phone]

## 2015-01-04 ENCOUNTER — Ambulatory Visit (HOSPITAL_COMMUNITY)
Admission: RE | Admit: 2015-01-04 | Discharge: 2015-01-04 | Disposition: A | Discharge status: Home / Self Care | Payer: Medicare Other | Source: Ambulatory Visit | Attending: Internal Medicine | Admitting: Internal Medicine

## 2015-01-04 DIAGNOSIS — M79604 Pain in right leg: Secondary | ICD-10-CM | POA: Diagnosis not present

## 2015-01-04 DIAGNOSIS — M48061 Spinal stenosis, lumbar region without neurogenic claudication: Secondary | ICD-10-CM

## 2015-01-04 DIAGNOSIS — M545 Low back pain: Secondary | ICD-10-CM | POA: Diagnosis present

## 2015-01-08 ENCOUNTER — Emergency Department (HOSPITAL_COMMUNITY)
Admission: EM | Admit: 2015-01-08 | Discharge: 2015-01-08 | Disposition: A | Discharge status: Home / Self Care | Payer: Medicare Other | Attending: Emergency Medicine | Admitting: Emergency Medicine

## 2015-01-08 ENCOUNTER — Encounter (HOSPITAL_COMMUNITY): Payer: Self-pay | Admitting: Emergency Medicine

## 2015-01-08 DIAGNOSIS — N4289 Other specified disorders of prostate: Secondary | ICD-10-CM | POA: Insufficient documentation

## 2015-01-08 DIAGNOSIS — Z8719 Personal history of other diseases of the digestive system: Secondary | ICD-10-CM | POA: Diagnosis not present

## 2015-01-08 DIAGNOSIS — Z87891 Personal history of nicotine dependence: Secondary | ICD-10-CM | POA: Diagnosis not present

## 2015-01-08 DIAGNOSIS — Z862 Personal history of diseases of the blood and blood-forming organs and certain disorders involving the immune mechanism: Secondary | ICD-10-CM | POA: Insufficient documentation

## 2015-01-08 DIAGNOSIS — H9190 Unspecified hearing loss, unspecified ear: Secondary | ICD-10-CM | POA: Diagnosis not present

## 2015-01-08 DIAGNOSIS — M549 Dorsalgia, unspecified: Secondary | ICD-10-CM | POA: Diagnosis present

## 2015-01-08 DIAGNOSIS — E780 Pure hypercholesterolemia: Secondary | ICD-10-CM | POA: Insufficient documentation

## 2015-01-08 DIAGNOSIS — I10 Essential (primary) hypertension: Secondary | ICD-10-CM | POA: Insufficient documentation

## 2015-01-08 DIAGNOSIS — Z79899 Other long term (current) drug therapy: Secondary | ICD-10-CM | POA: Insufficient documentation

## 2015-01-08 LAB — BASIC METABOLIC PANEL
Anion gap: 9 (ref 5–15)
BUN: 21 mg/dL (ref 6–23)
CO2: 25 mmol/L (ref 19–32)
Calcium: 9.5 mg/dL (ref 8.4–10.5)
Chloride: 104 mEq/L (ref 96–112)
Creatinine, Ser: 1.3 mg/dL (ref 0.50–1.35)
GFR calc Af Amer: 56 mL/min — ABNORMAL LOW (ref >90)
GFR calc non Af Amer: 48 mL/min — ABNORMAL LOW (ref >90)
Glucose, Bld: 91 mg/dL (ref 70–99)
Potassium: 3.2 mmol/L — ABNORMAL LOW (ref 3.5–5.1)
Sodium: 138 mmol/L (ref 135–145)

## 2015-01-08 LAB — CBC WITH DIFFERENTIAL/PLATELET
BASOS PCT: 0 % (ref 0–1)
Basophils Absolute: 0 10*3/uL (ref 0.0–0.1)
EOS PCT: 0 % (ref 0–5)
Eosinophils Absolute: 0 10*3/uL (ref 0.0–0.7)
HEMATOCRIT: 40.6 % (ref 39.0–52.0)
Hemoglobin: 13.4 g/dL (ref 13.0–17.0)
LYMPHS PCT: 14 % (ref 12–46)
Lymphs Abs: 0.9 10*3/uL (ref 0.7–4.0)
MCH: 26.6 pg (ref 26.0–34.0)
MCHC: 33 g/dL (ref 30.0–36.0)
MCV: 80.7 fL (ref 78.0–100.0)
MONO ABS: 0.5 10*3/uL (ref 0.1–1.0)
MONOS PCT: 7 % (ref 3–12)
NEUTROS ABS: 5.2 10*3/uL (ref 1.7–7.7)
NEUTROS PCT: 79 % — AB (ref 43–77)
Platelets: 240 10*3/uL (ref 150–400)
RBC: 5.03 MIL/uL (ref 4.22–5.81)
RDW: 15.3 % (ref 11.5–15.5)
WBC: 6.7 10*3/uL (ref 4.0–10.5)

## 2015-01-08 LAB — URINALYSIS, ROUTINE W REFLEX MICROSCOPIC
Bilirubin Urine: NEGATIVE
GLUCOSE, UA: NEGATIVE mg/dL
Ketones, ur: NEGATIVE mg/dL
Leukocytes, UA: NEGATIVE
Nitrite: NEGATIVE
PROTEIN: 100 mg/dL — AB
Specific Gravity, Urine: 1.018 (ref 1.005–1.030)
Urobilinogen, UA: 0.2 mg/dL (ref 0.0–1.0)
pH: 5.5 (ref 5.0–8.0)

## 2015-01-08 LAB — URINE MICROSCOPIC-ADD ON

## 2015-01-08 MED ORDER — POTASSIUM CHLORIDE CRYS ER 20 MEQ PO TBCR
40.0000 meq | EXTENDED_RELEASE_TABLET | Freq: Once | ORAL | Status: AC
  Administered 2015-01-08: 40 meq via ORAL
  Filled 2015-01-08: qty 2

## 2015-01-08 MED ORDER — HYDROCODONE-ACETAMINOPHEN 5-325 MG PO TABS: 0.5000 | ORAL_TABLET | ORAL | Status: DC | PRN

## 2015-01-08 NOTE — ED Provider Notes (Signed)
CSN: [id number]     Arrival date & time 01/08/15  1014 History   First MD Initiated Contact with Patient 01/08/15 1055     Chief Complaint  Patient presents with  . Back Pain     (Consider location/radiation/quality/duration/timing/severity/associated sxs/prior Treatment) HPI Comments: Patient sent to the emergency department by primary doctor for evaluation of abnormal MRI. Patient has been complaining of back pain. He does report a history of back surgery many years ago. His primary doctor obtained an MRI of his lumbar spine last week and results were suspicious for invasive prostate cancer into the bladder and lumbar spine.  At arrival to the ER, patient is complaining of minimal back pain. No numbness, tingling or weakness of the lower extremities.  Patient is a 81 y.o. male presenting with back pain.  Back Pain   Past Medical History  Diagnosis Date  . HTN (hypertension)   . Right carotid bruit   . Hypercholesteremia   . RBBB   . GERD (gastroesophageal reflux disease)   . Iron deficiency anemia    Past Surgical History  Procedure Laterality Date  . Inguinal hernia repair    . Lumbar spine surgery      x2  . Hemorroidectomy     Family History  Problem Relation Age of Onset  . Stroke    . Diabetes    . Aneurysm    . Breast cancer    . Hyperlipidemia    . Heart attack Mother   . Other Father     complication of surgery   History  Substance Use Topics  . Smoking status: Former Smoker -- 0.50 packs/day for 5 years    Quit date: 12/21/1961  . Smokeless tobacco: Never Used  . Alcohol Use: No    Review of Systems  Musculoskeletal: Positive for back pain.  All other systems reviewed and are negative.     Allergies  Review of patient's allergies indicates no known allergies.  Home Medications   Prior to Admission medications   Medication Sig Start Date End Date Taking? Authorizing Provider  amLODipine (NORVASC) 10 MG tablet Take 1 tablet by mouth daily.  12/18/14  Yes Historical Provider, MD  atorvastatin (LIPITOR) 40 MG tablet Take 40 mg by mouth every morning.    Yes Historical Provider, MD  diltiazem (CARDIZEM CD) 120 MG 24 hr capsule Take 1 capsule by mouth daily.  12/18/14  Yes Historical Provider, MD  gabapentin (NEURONTIN) 300 MG capsule Take 300 mg by mouth at bedtime as needed (nerve pain).   Yes Historical Provider, MD  acetaminophen (TYLENOL) 500 MG tablet Take 1 tablet (500 mg total) by mouth every 6 (six) hours as needed for mild pain or moderate pain. 08/26/14   Gerhard Munch, MD   BP 174/69 mmHg  Pulse 85  Temp(Src) 97.8 F (36.6 C) (Oral)  Resp 16  SpO2 98% Physical Exam  Constitutional: He is oriented to person, place, and time. He appears well-developed and well-nourished. No distress.  HENT:  Head: Normocephalic and atraumatic.  Nose: Nose normal.  Mouth/Throat: Oropharynx is clear and moist and mucous membranes are normal.  Hard of hearing  Eyes: Conjunctivae and EOM are normal. Pupils are equal, round, and reactive to light.  Neck: Normal range of motion. Neck supple.  Cardiovascular: Regular rhythm, S1 normal and S2 normal.  Exam reveals no gallop and no friction rub.   No murmur heard. Pulmonary/Chest: Effort normal and breath sounds normal. No respiratory distress. He exhibits no tenderness.  Abdominal: Soft. Normal appearance and bowel sounds are normal. There is no hepatosplenomegaly. There is no tenderness. There is no rebound, no guarding, no tenderness at McBurney's point and negative Murphy's sign. No hernia.  Musculoskeletal: Normal range of motion.  Neurological: He is alert and oriented to person, place, and time. He has normal strength. No cranial nerve deficit or sensory deficit. Coordination normal. GCS eye subscore is 4. GCS verbal subscore is 5. GCS motor subscore is 6.  Skin: Skin is warm, dry and intact. No rash noted. No cyanosis.  Psychiatric: He has a normal mood and affect. His speech is normal  and behavior is normal. Thought content normal.  Nursing note and vitals reviewed.   ED Course  Procedures (including critical care time) Labs Review Labs Reviewed  CBC WITH DIFFERENTIAL - Abnormal; Notable for the following:    Neutrophils Relative % 79 (*)    All other components within normal limits  BASIC METABOLIC PANEL - Abnormal; Notable for the following:    Potassium 3.2 (*)    GFR calc non Af Amer 48 (*)    GFR calc Af Amer 56 (*)    All other components within normal limits  URINALYSIS, ROUTINE W REFLEX MICROSCOPIC  PSA    Imaging Review No results found.   EKG Interpretation None      MDM   Final diagnoses:  None  Pelvic Mass  Patient referred to the ER for evaluation of likely newly diagnosed prostate cancer. Patient has been complaining of back pain and had an MRI performed as an outpatient. Results showed what appears to be invasive prostate cancer infiltrating bladder and bone.  At arrival to the ER, however, patient is without complaints. He is not experiencing any lower back pain at this time. He has not had any difficulty with urination. This examination is unremarkable. I discussed the case with Dr. Marlou PorchHerrick, on call for urology. He confirms that there are no emergent procedures or intervention necessary. Patient will need to follow-up in the office to have a prostate biopsy. Patient had basic labs and urinalysis performed here in the ER, no abnormalities noted. He will be provided analgesia in the event that he needs it. Urology will see him in the office this week.    Gilda Creasehristopher J. Pollina, MD 01/08/15 1245

## 2015-01-08 NOTE — Discharge Instructions (Signed)
Follow-up with urology, either in [place] or here in [place] as soon as possible.  Possible Prostate Cancer  Prostate cancer is the abnormal growth of cells in your prostate gland. Your prostate gland is involved in the production of semen. It is located below your bladder and in front of your rectum. A normal prostate gland is the size of a walnut and surrounds the tube that carries urine from the bladder (urethra).  RISK FACTORS  Age older than 65 years.  African American race.  Obesity.  Family history of prostate cancer.  Family history of breast cancer. SIGNS AND SYMPTOMS   Frequent urination.  Weak or interrupted flow of urine.  Difficulty starting or stopping urination.  Inability to urinate.  Painful or burning urination.  Painful ejaculation.  Blood in urine or semen.  Persistent pain or discomfort in the lower back, lower abdomen, hips, or upper thighs.  Difficulty getting an erection.  Difficulty emptying your bladder completely. DIAGNOSIS  Prostate cancer can be diagnosed by a digital rectal exam, prostate-specific antigen (PSA) blood test, transrectal ultrasonography, and then a biopsy to test a tissue sample. Usually 8-12 samples are taken. The tissue is sent to a specialist who looks at tissues and cells (pathologist). If cancer is diagnosed, the next step is to stage the cancer. This means it is put in a category based on how far the cancer has spread. This is important for helping your health care providers plan appropriate treatment. The different stages of prostate cancer are as follows:  Stage I. Cancer is found in the prostate only. It cannot be felt during a digital rectal exam and is not visible by imaging. It is usually found accidentally, such as during surgery for other prostate problems.  Stage II. Cancer is more advanced than in stage I but has not spread outside the prostate.  Stage III. Cancer has spread beyond the outer layer of the  prostate to nearby tissues. Cancer may be found in the seminal vesicles.  Stage IV. Cancer has spread to lymph nodes or to other parts of the body. The cancer may have spread to the bladder, rectum, bones, liver, or lungs. Prostate cancer often spreads to the bones. Imaging scans, such as a bone scan, CT, PET, or MRI, are done to help in the staging process. TREATMENT  Treatments such as surgery, medicines, and radiation may be recommended based on the stage of the cancer and other factors. Once cancer of the prostate has been diagnosed, your health care provider will discuss your treatment with you. Your health care provider will help you decide on the best course of treatment. Treatment often depends on your age, health, and other risk factors. The more common methods of treatment are:  Observation for early stage prostate cancer.  Open surgery. This involves a surgery to remove the prostate.  Laparoscopic prostatectomy to remove the prostate and lymph nodes.  Robotic prostatectomy to remove the prostate and lymph nodes.  External beam radiation, which aims beams of radiation from outside the body at the prostate.  Internal radiation (brachytherapy), which uses radioactive needles, 40-100 pellets (seeds), wires, or catheters implanted directly into the prostate gland.  High-intensity, focused ultrasonography to destroy cancer cells.  Cryosurgery to freeze and destroy prostate cancer cells.  Chemotherapy medicines to stop the growth of cancer cells either by killing them or by stopping them from multiplying.  Hormone treatment. Medicines that stop your body from producing testosterone, or medicines that block testosterone from reaching cancer cells.  Orchiectomy. This is surgery to remove your testicles. HOME CARE INSTRUCTIONS  Take medicines only as directed by your health care provider.  Maintain a healthy diet.  Get plenty of sleep.  Consider joining a support group. This may  help you learn to cope with the stress of having prostate cancer.  Seek advice to help you manage treatment side effects.  Keep all follow-up visits as directed by your health care provider.  Inform your cancer specialist if you are admitted to the hospital.  Continue sexual expression. If you experience erectile dysfunction, your natural reaction will be to pull away and avoid all sexual contact. Consider touching, holding, hugging, and caressing as ways to continue sharing sexuality with your partner. SEEK MEDICAL CARE IF:  You have trouble urinating.  You have blood in your urine.  You have trouble getting an erection.  You have pain in your hips, back, or chest. SEEK IMMEDIATE MEDICAL CARE IF:  You have weakness or numbness in your legs.  You have involuntary loss of urine or stool (incontinence). Document Released: 12/07/2005 Document Revised: 04/23/2014 Document Reviewed: 05/26/2013 [facility] Patient Information 2015 [place], [place]. This information is not intended to replace advice given to you by your health care provider. Make sure you discuss any questions you have with your health care provider.

## 2015-01-08 NOTE — ED Notes (Signed)
Pt, being sent by Sherlene ShamsFresco MD, c/o back pain.  Pt has Hx of enlarged prostate.  R/o prostate CA.  Per MD, Pt had recent CT which noted an invasion.

## 2015-01-08 NOTE — ED Notes (Signed)
Patient comes from home with c/o bilateral low back pain.  Patient states he has had the pain for several months.  Patient describes the pain as a "tightness."  Patient denies N/V/D, shortness of breath and chest pain.  Patient denies difficulty with urination or hematuria.  Patient's son at bedside state patient has had some episodes of confusion.    On exam, patient's lung sounds are clear bilaterally.  Heart sounds are WNL with no murmurs, S3, S4 or split auscultated. +2 radial and dorsalis pedis pulses palpated.  Bowel sounds are present and abdomen is soft and non-tender to palpation.  CN III intact.  Patient is alert and oriented to person, place, time and events.

## 2015-01-09 LAB — PSA: PSA: 13.58 ng/mL — ABNORMAL HIGH (ref <=4.00)

## 2015-04-04 ENCOUNTER — Other Ambulatory Visit (HOSPITAL_COMMUNITY): Payer: Self-pay | Admitting: Family Medicine

## 2015-04-04 DIAGNOSIS — R1084 Generalized abdominal pain: Secondary | ICD-10-CM

## 2015-04-04 DIAGNOSIS — R319 Hematuria, unspecified: Secondary | ICD-10-CM

## 2015-04-09 ENCOUNTER — Encounter (HOSPITAL_COMMUNITY): Payer: Self-pay

## 2015-04-09 ENCOUNTER — Ambulatory Visit (HOSPITAL_COMMUNITY)
Admission: RE | Admit: 2015-04-09 | Discharge: 2015-04-09 | Disposition: A | Discharge status: Home / Self Care | Payer: Medicare Other | Source: Ambulatory Visit | Attending: Family Medicine | Admitting: Family Medicine

## 2015-04-09 DIAGNOSIS — R1084 Generalized abdominal pain: Secondary | ICD-10-CM

## 2015-04-09 DIAGNOSIS — R319 Hematuria, unspecified: Secondary | ICD-10-CM

## 2015-04-09 LAB — POCT I-STAT CREATININE: Creatinine, Ser: 1.4 mg/dL — ABNORMAL HIGH (ref 0.50–1.35)

## 2015-04-09 MED ORDER — IOHEXOL 300 MG/ML  SOLN
100.0000 mL | Freq: Once | INTRAMUSCULAR | Status: AC | PRN
  Administered 2015-04-09: 100 mL via INTRAVENOUS

## 2015-04-09 NOTE — H&P (Signed)
PATIENT NAME:  Jake Griffin, Jake Griffin MR#:  [id number] DATE OF BIRTH:  1927-09-30  DATE OF ADMISSION:  09/23/2012  PRIMARY CARE PHYSICIAN: Redmond Baseman, DO  REFERRING PHYSICIAN: Dorothea Glassman, MD   CHIEF COMPLAINT: Chest pain.   HISTORY OF PRESENT ILLNESS: Jake Griffin is a nice 81 year old gentleman who is actually a patient of Dr. Gwen Pounds. He was admitted about a month ago on 09/11 for a heart catheterization.  The patient underwent a heart catheterization without complications and underwent a DES placement x2. At that time the patient had a first lesion in the distal circumflex, and he underwent a drug eluding stent due to a 99% obstruction in the lesion. The second lesion was on the mid circumflex, the patient had a drug-eluting stent  due to 80% blockage. He was diagnosed with significant two-vessel coronary artery disease with perfusion defect of the posterior wall, normal left ventricular function. There was no CBC drawn at that moment, only a BMP that showed a creatinine of 1.5. Today his creatinine is 2.23. The patient states that he has never had anemia but today showed he showed up and his hemoglobin is 4.9 at the ER. The reason why he came originally was because he started developing chest pain with exertion which has been a normal pattern for him since he had exertional or stable dyspnea and stable angina.   In the past, he had significant episodes of chest pain and shortness of breath consistent with angina and an abnormal stress test for which he underwent his catheterization. The patient is being given Plavix and aspirin, and so far he has been stable up to today. Today he started having pain after just walking and getting around. His pain was located in the middle of the chest retrosternal, associated with pressure-like feeling and tingling of the chest and shortness of breath. He started having some mild sweating but no prior diaphoresis, and for that reason he decided to come here to the  ER. He did not have any syncopal episodes, and his shortness of breath and chest pain resolved after he started resting and after while. At this moment, the patient is asymptomatic.   The patient states that he has not been dizzy or lightheaded, and he has never had loss of consciousness or syncopal episodes. The patient states that he has been treated for prostate infection with Bactrim for one week, and the patient started getting dizzy and lightheaded a week ago after he was started on the antibiotics. He states that he has had this medication before and never noticed a problem. The patient has had a colonoscopy about 2 years ago, and he states that it was normal. As well, he had an EGD and it was normal. There is no sign of melena, hematemesis, or hematochezia and no blood in the urine. He was told that his prostate is enlarged and he had prostatitis.   REVIEW OF SYSTEMS: CONSTITUTIONAL: The patient denies any fever, positive mild fatigue. No weakness. No weight loss or weight gain. EYES: Denies any blurry vision, double vision, glaucoma or inflammation of the eyes. ENT: Denies any tinnitus, ear aches, difficulty swallowing, postnasal drip or epistaxis. RESPIRATORY: Denies any cough, wheezing. Positive shortness of breath with chest pain but not significant at this moment. Denies any chronic obstructive pulmonary disease or pneumonia. CARDIOVASCULAR: As mentioned above, chest pain but no orthopnea, no edema, no arrhythmias or palpitations. No syncopal episodes. GI: No nausea, vomiting, abdominal pain. No hematemesis, melena. No gastroesophageal reflux disease.  No jaundice. No rectal bleeding, no constipation. No change in the bowel habits. No hemorrhoids. No hernias. GENITOURINARY: No dysuria. No hematuria. No kidney stones. No frequency, no incontinence. Positive treatment for prostatitis after having some significant prostate pain that has resolved. ENDOCRINOLOGY: No polyuria, polydipsia, or polyphagia. No  cold or heat intolerance. No thyroid problems. HEMATOLOGIC/LYMPHATICS: He denies having anemia in the past. He denies any easy bruising. No signs of bleeding, no  swollen glands. SKIN: No acne, no rashes, no lesions. MUSCULOSKELETAL: Denies any significant back pain, neck pain, shoulder pain. No gout. No swelling of joints. NEUROLOGICAL: No numbness, tingling, dysarthria or ataxia. No CVAs or transient ischemic attacks. PSYCHIATRIC: No anxiety. No insomnia. No depression.    PAST MEDICAL HISTORY:  1. Coronary artery disease, two-vessel disease and is status post two drug-eluting stents a month ago.  2. Stable angina.  3. Hypertension.  4. Dyslipidemia.  5. Benign prostatic hypertrophy.  6. Gastroesophageal reflux disease.   ALLERGIES: No known drug allergies.   PAST SURGICAL HISTORY:  1. Two back surgeries due to ruptured disks.  2. Knee surgery due to bone spur.  3. Colonoscopy about 2 years ago.  4. Drug-eluting stents  x2 a month ago in  08/2012.   FAMILY HISTORY: Positive for coronary disease in his brother. He denies any cancer or diabetes. His dad died of complications of a surgical procedure on his intestines. His mom died from old age.   SOCIAL HISTORY: The patient states that he smoked for about 10 or 15 years and quit in 1956. He denies any alcohol use. He is retired, and he lives with his wife. He is a very active individual.   CURRENT MEDICATIONS:  1. Senna 1 p.o. b.i.d. 2. Protonix 40 mg once daily.  3. Proscar 5 mg once daily.  4. Plavix 75 mg once daily.  5. Micardis with hydrochlorothiazide 80 mg, 12.5 mg once daily.  6. Lyrica 100 mg t.i.d.  7. Lipitor 80 mg once a day. 8. Imdur 30 mg once daily.  9. Furosemide 40 mg b.i.d.  10. Centrum Silver once daily.  11. Coreg 12.5 mg b.i.d.  12. Bactrim DS b.i.d. for  prostate infection.  13. Aspirin 81 mg once daily.  14. Alprazolam 0.5 mg once daily.   PHYSICAL EXAMINATION:  VITAL SIGNS: Blood pressure 122/62, pulse  68, respiratory rate 22, temperature 98.2. The patient is 99% oxygen saturation on room air.   GENERAL: The patient is alert and oriented x3. No acute distress. No respiratory distress, comfortable sitting on a stretcher, well nourished, well hydrated.   HEENT: Pupils are equal and reactive. Extraocular movements are intact. Mucosa is moist. His conjunctivae are pale. His sclerae are anicteric. No oral lesions. No oropharyngeal exudates. His mucosa is also pale.   NECK: Supple. No JVD. No thyromegaly. No adenopathy. No carotid bruits auscultated. Range of motion of neck is within normal limits.   CARDIOVASCULAR: Regular rate and rhythm. Positive systolic ejection murmur, 2 to 3/6, no radiation of the murmur that I can tell. PMI is not displaced.   LUNGS: Clear without any wheezing or crepitus. No use of accessory muscles. No dullness to percussion.   ABDOMEN: Soft, nontender, nondistended. No hepatosplenomegaly. No masses. Bowel sounds are positive. No hepatic stigmata.   GENITAL: Negative for external lesions. Site of heart catheterization entrance on the groin is healed, and there are no signs of ecchymosis or hematomas.   EXTREMITIES: No edema, no cyanosis. No clubbing. Pulses +2 distally. Capillary refill  less than 3.0.   SKIN: Without any petechiae or rashes. No jaundice of the skin.   LYMPHATICS: Negative for lymphadenopathy in neck, supraclavicular, axillary, groin or epitrochlear.   PSYCHIATRIC: Mood is normal without any signs of depression or anxiety. The patient takes Ativan for nervousness, but the patient is very comfortable right now. No anxiety at all.   MUSCULOSKELETAL: Negative for significant joint effusions.   BACK: There is no deformities or point tenderness to palpation.   NEUROLOGIC: Cranial nerves II through XII are intact. Strength is five out of five in all four extremities.   LABORATORY, DIAGNOSTIC AND RADIOLOGICAL DATA: Glucose is 117, creatinine is 2.23,  previous creatinine about a month ago was 1.5. Electrolytes pretty much are within normal limits with sodium of 141 potassium 4.3. LFTs: Total bilirubin is 0.3. Normal AST, ALT. Cardiac enzymes: CK-MB 2.4. White count is 4.3, hemoglobin is 4.9, hematocrit is 15, platelets are 221, and the characteristics of the anemia are normocytic hypochromic with elevated RDW. EKG: Normal sinus rhythm. No ST depression or elevation. The patient had an ejection fraction of 55% with left atrium dilation,  right atrium moderately dilated, severe mitral regurgitation, moderate-to-severe tricuspid regurgitation, and mild aortic regurgitation. This is from 07/01/2012.   ASSESSMENT AND PLAN:  1. Chest pain: The patient has stable angina. He is status post drug-eluting stents for which he is on Plavix and aspirin, and those drug-eluting stents were put about a month ago for which the patient needs to stay on Plavix and aspirin for now. We are going to try to get Cardiology to give us an opinion on that if the patient has any significant bleeding what we are going to do as far Plavix and aspirin. The reason why the patient is having angina now is mostly for severe anemia with a hemoglobin of 4.9 and decreased oxygenation with demand. The patient is admitted for transfusion.  2. Anemia: Unknown the status of the anemia. The records in the hospital show that the last time he had a heart catheterization there was not a CBC, and there is no CBC listed on this patient. We are going to try to get records from the primary care physician. As far as I can tell, the patient has never had anemia, and he had a GI work-up done about two years ago. So, in the differential diagnosis of course we have to consider GI sources, GI bleeding as number one. The patient does not have any melena, hematemesis, or hematochezia. We are going to get guaiacs to rule out any occult bleeding and occult malignancy. Gastrointestinal consult is ordered.   3. Hemolysis: The patient has been started on a new antibiotic, Bactrim, which could cause hemolysis in some patients with G6PD deficiency, for which we are going to check the G6PD in blood. Other differentia, hematuria. The patient denies any visible hematuria. We are going to get a urinalysis.  4. Chronic anemia: It does not seem like this is aplastic anemia. His platelets are normal. His white count is overall normal. He has microcytic hypochromic anemia for what could be iron deficiency. Iron levels are going to be checked. Hemolytic labs have been changed, although his bilirubin is normal, which it is very likely that he has hemolysis.   5. Acute kidney injury: The patient has acute kidney injury with increase of creatinine likely due to prerenal conditions since the patient is so anemic. We are going to transfuse and give small amounts of IV fluids. That should help  if this is a real prerenal factor. He did have some contrast for his heart catheterization a month ago for which it is possible that could play a role. I cannot get a FENA since the patient is on Lasix, and I do not think a fraction excretion of urea will help me much either. So, we are going to follow up creatinine tomorrow.  6. Hypertension: Continue blood pressure medications.  7. Coronary artery disease: As mentioned above, the patient has stable angina and is status post DES. Since the patient has these problems, I think his hemoglobin should be above 10.0. I am going to order 2 units of red blood cells as a transfusion since 1 unit won't get him close to that number, and we are going to re-evaluate and see if he needs more. We will ask Dr. Gwen Pounds to comment on the hemoglobin goal for this patient as well.  8. Dyslipidemia: Continue treatment with Lipitor. As well, since he has coronary artery disease, the patient is on a beta blocker, Imdur, aspirin, Plavix, statin and ARB.  9. Prostatitis: The patient has been treated with Bactrim.  We are going to stop Bactrim for now and re-evaluate if he really needs the antibiotic.  10. Deep vein thrombosis prophylaxis: At this moment, only mechanical measurements are going to be done since the patient has such a low hemoglobin. I am not going to hold the Plavix, and I am not going to hold the aspirin since the patient had a drug-eluting stent  about a month ago.   CODE STATUS:  The patient is a FULL CODE.     TIME SPENT: 55 minutes.    ____________________________ Felipa Furnace, MD rsg:cbb D: 09/23/2012 16:15:41 ET T: 09/23/2012 16:57:24 ET JOB#: [id number]  cc: Felipa Furnace, MD, <Dictator> Redmond Baseman, DO Aunica Dauphinee Juanda Chance MD ELECTRONICALLY SIGNED 09/29/2012 22:14

## 2015-04-09 NOTE — Consult Note (Signed)
CC: severe anemia and heme postiive stool.  Pt on plavix for stent.  Haptoglobin normal,  hgb 9.3 after recent transfusion. creat 1.78, chest clear, BP 146/83.  Plan for EGD tomorrow and colonoscopy to follow Tuesday. Pt without complaints, walked hall without SOB or chest pain.  Electronic Signatures: Scot JunElliott, Robert T (MD)  (Signed on 06-Oct-13 10:53)  Authored  Last Updated: 06-Oct-13 10:53 by Scot JunElliott, Robert T (MD)

## 2015-04-09 NOTE — Discharge Summary (Signed)
PATIENT NAME:  Jake Griffin, Jake Griffin MR#:  [id number] DATE OF BIRTH:  March 19, 1927  DATE OF ADMISSION:  09/23/2012 DATE OF DISCHARGE:  09/28/2012  DISCHARGE DIAGNOSES: 1. Severe iron deficiency anemia status post 4 units of packed red blood cell transfusion and now hemodynamically stable. Resume aspirin and Plavix per gastroenterology recommendation. Normal esophagogastroduodenoscopy. Colonoscopy showing diverticulosis and four very small polyps status post removal, also internal hemorrhoids. Gastroenterology recommending Protonix in the morning and Plavix in the evening to lessen the chances of interaction. Also iron supplement recommended lifelong per gastroenterology.  2. Hypertension, now resolved.  3. Angina, likely anemia related and likely due to supply/demand ischemia. No cardiac injury noted. Troponins are fine. Cardiology recommending resuming Plavix.  4. Acute on chronic renal failure stage II to III, close to baseline with creatinine 1.5 on discharge.   SECONDARY DIAGNOSES:  1. Coronary artery disease.  2. Angina.  3. Hypertension.  4. Dyslipidemia.  5. Benign prostatic hypertrophy. 6. Gastroesophageal reflux disease.   CONSULTANTS:  1. GI, Dr. Mechele CollinElliott.  2. Cardiology, Dr. Juliann Paresallwood.   LABORATORY, DIAGNOSTIC AND RADIOLOGICAL DATA:  EGD on 10/07 which was within normal limits.   Colonoscopy on 10/08 showed diverticulosis and four very small polyps status post removal, also internal hemorrhoids.   Chest x-ray on 10/04 showed no acute cardiopulmonary disease.   Urinalysis on admission was negative.   Serum haptoglobin was within normal limits with a value of 141.   G6PD level were within normal limits.   HISTORY AND SHORT HOSPITAL COURSE: Patient is an 81 year old male with above-mentioned medical problems was admitted for angina thought to be due to demand ischemia due to severe anemia. Patient required about 4 units of packed red blood cell transfusion due to severe deficiency  anemia. Please see Dr. Mordecai MaesSanchez' dictated history and physical for further details. Patient's hemoglobin on admission was 4.9, after 4 units of packed red blood cell transfusion, hemoglobin came up to 9.5. Cardiology consultation was obtained with Dr. Juliann Paresallwood who recommended GI work-up as all the cardiac symptoms were thought to be due to severe anemia. Patient had negative three sets of cardiac enzymes. GI consultation was obtained with Dr. Mechele CollinElliott who recommended EGD which was performed on 10/07 and was within normal limits. Subsequently colonoscopy was performed on 10/08 which showed diverticulosis and four very small polyps. There were no signs of bleeding. Patient was also started on iron replacement therapy as she was found to have severe iron deficiency. His serum iron level was 16 and iron saturation was 4%. His ferritin level was 7.   He was also found to have acute on chronic renal failure stage II to III  and was improved with hydration with likely baseline creatinine of 1.5. He was doing much better on 10/09 and was discharged home in stable condition.   PHYSICAL EXAMINATION: VITAL SIGNS: On the date of discharge his vital signs are as follows: Temperature 97.7, heart rate 60 per minute, respirations 16 per minute, blood pressure 124/65 mmHg. He was saturating 96% on room air. Pertinent physical examination on the date of discharge: CARDIOVASCULAR: S1, S2 normal. No murmurs, rales, or gallop. LUNGS: Clear to auscultation bilaterally. No wheezes, rales, rhonchi, crepitation. ABDOMEN: Soft, benign. NEUROLOGIC: Nonfocal examination. All other physical examination remained at the baseline.   DISCHARGE MEDICATIONS:  1. Lasix 40 mg p.o. b.i.d.  2. Proscar 5 mg p.o. daily.  3. Micardis HCTZ 80/12 .5, 1 tablet p.o. daily.  4. Alprazolam 0.5 mg half to 1 tablet p.o. daily  at bedtime.  5. Centrum Silver once daily.  6. Lyrica 100 mg p.o. t.i.d.  7. Aspirin 81 mg p.o. daily.  8. Lipitor 80 mg p.o.  daily.  9. Senna 1 tablet p.o. b.i.d.  10. Imdur 30 mg p.o. daily.  11. Protonix 40 mg p.o. daily.  12. Plavix 75 mg p.o. daily.  13. Coreg 12.5 mg p.o. at bedtime.   DISCHARGE DIET: Low sodium.   DISCHARGE ACTIVITY: As tolerated.   DISCHARGE INSTRUCTIONS AND FOLLOW UP: Patient was instructed to follow up to his primary care physician, Dr. Jayme Cloud at [facility] in 1 to 2 weeks. He will need follow up with Dr. Mechele Collin from GI in 2 to 4 weeks and Dr. Lady Gary from cardiology in 4 to 6 weeks. Patient was also instructed to stop that Bactrim.    TOTAL TIME DISCHARGING THIS PATIENT: 45 minutes.   ____________________________ Ellamae Sia. Sherryll Burger, MD vss:cms D: 10/02/2012 16:04:37 ET T: 10/03/2012 09:56:55 ET JOB#: [id number]  cc: Attila Mccarthy S. Sherryll Burger, MD, <Dictator> Darlin Priestly. Lady Gary, MD Scot Jun, MD Redmond Baseman, DO Ellamae Sia [facility] MD ELECTRONICALLY SIGNED 10/04/2012 19:20

## 2015-04-09 NOTE — Consult Note (Signed)
PATIENT NAME:  Jake Griffin, Jake Griffin MR#:  [id number] DATE OF BIRTH:  1927-10-30  DATE OF CONSULTATION:  09/24/2012  REFERRING PHYSICIAN:  [facility], Dr. Mechele Collin CONSULTING PHYSICIAN:  Dwayne D. Callwood, MD  INDICATION: Coronary artery disease and anemia.   HISTORY OF PRESENT ILLNESS: Jake Griffin is an 81 year old male patient of Dr. Gwen Pounds admitted back on 08/31/2012, had a cardiac catheterization and was found to have severe coronary artery disease. He underwent angioplasty and stenting with DES stents times two in the circumflex area. He was treated with Plavix and has reportedly done reasonably well. He now presents with significant disease, found to have hemoglobin of 4.9, hematocrit of 15, creatinine 1.5. The patient denies any discrete chest pain, but felt weak and tired, so he finally came to the Emergency Room for evaluation.   REVIEW OF SYSTEMS: No blackout spells or syncope. No nausea or vomiting. No fever. No chills. No sweats. No weight loss or weight gain. No hemoptysis or hematemesis. No bright red blood per rectum.   PAST MEDICAL HISTORY: 1. Coronary artery disease.  2. Stable angina.  3. Hypertension.  4. Dyslipidemia.  5. Benign prostatic hypertrophy.  6. Reflux.   ALLERGIES: None.   PAST SURGICAL HISTORY:  1. Back surgeries. 2. Angioplasty and stenting about a month ago.  3. Knee surgery.  4. Colonic polyps.   FAMILY HISTORY: Coronary artery disease.   SOCIAL HISTORY:  Smoked, but quit years ago. Retired. Lives with his wife. Active.   MEDICATIONS:  1. Senna, 1 twice a day.  2. Protonix 40 mg a day.  3. Proscar 5 once a day.  4. Plavix 75 a day.  5. Micardis HCTZ 80/12.5 once a day.  6. Lyrica 100 three times a day. 7. Lipitor 80 a day.  8. Imdur 30 a day.  9. Lasix 40 twice a day.  10. Centrum Silver. 11. Coreg 12.5 twice a day.  12. Bactrim DS twice a day for  infection.  13. Aspirin 81 mg a day.  14. Alprazolam 0.5 once a day.   PHYSICAL EXAMINATION:   VITAL SIGNS: Blood pressure 120/60, pulse 65, respiratory rate 18, afebrile.   HEENT: Normocephalic, atraumatic. Pupils are reactive to light.   NECK: Supple. No significant jugular venous distention, bruits, or adenopathy.   LUNGS: Clear to auscultation and percussion. No significant rhonchi, wheeze, or rales.   HEART: Regular rate and rhythm. Positive bowel sounds. No rebound, guarding, or tenderness.   EXTREMITIES: Within normal limits.   NEUROLOGIC: Intact.   SKIN: Normal.  LABORATORY DATA:  Glucose 117, creatinine 2.23, sodium 141, potassium 4.3. LFTs normal. Cardiac enzymes at this point are normal. White count 4.3, hemoglobin 4.9, hematocrit 15, platelets 221.   EKG: Normal sinus rhythm, nonspecific ST-T wave changes.   ASSESSMENT:  1. Chest pain, angina.  2. Anemia.  3. Renal insufficiency.  4. Benign prostatic hypertrophy.  5. Hypertension.  6. Hyperlipidemia.   PLAN: I agree with admission and further evaluation. Follow up hemoglobin and hematocrit. Transfuse to at least 9 hemoglobin. We will consider holding aspirin as this probably represents a gastrointestinal bleed. I agree with GI input for scope upper and lower. He should be an acceptable surgical risk for scoping.  Would probably continue Plavix for now unless his bleeding gets worse. Since there is no frank blood, I would probably be in favor of just holding aspirin for now in the interim. Continue lipid management. Continue blood pressure control. Coronary artery disease should be at this point stable.  No evidence of subacute thrombosis. Again, would continue current therapy for now.     ____________________________ Bobbie Stackwayne D. Juliann Paresallwood, MD ddc:bjt D: 09/24/2012 11:37:36 ET T: 09/24/2012 11:49:18 ET JOB#: [id number]  cc: Dwayne D. Juliann Paresallwood, MD, <Dictator> Alwyn PeaWAYNE D CALLWOOD MD ELECTRONICALLY SIGNED 11/02/2012 10:11

## 2015-04-09 NOTE — Consult Note (Signed)
CC: severe iron def anemia.  Colonoscopy showed diverticulosis and 4 very small polyps all removed with cold forceps.  No blood and no signs of bleeding.  Internal hemorrhoids seen,  We can resume his asprin and he can continue to take Plavix.  Will give Protonix in morning and plavix in evening to lessen chance of interaction. Will resume food.  The endio unit has a capsule scheduled for tomorrow so I cannot do one tomorrow.  Will do one as out patient.  he should go home on iron pills and remain on them for a long time.  Follow up with us in a month.    Electronic Signatures: Scot JunElliott, Robert T (MD)  (Signed on 08-Oct-13 12:07)  Authored  Last Updated: 08-Oct-13 12:07 by Scot JunElliott, Robert T (MD)

## 2015-04-09 NOTE — Consult Note (Signed)
CC: severe iron def anemia-- recent drug eluting coronary stents placed a month ago, heme positive stool on my exam, low G6PD level with recent Septra use, (no significant rise in total bilirubin and pending haptoglobin) supposed colon and EGD 2 years ago.  It is of most importance to protect his cardiac stents, I spoke to Dr. Juliann Paresallwood and recommend restart Plavix immediately and have unsuspended the hold on the Plavix.  Agree with transfusion of 2 more units, may need iv lasix with those but at moment his chest is clear.  He will need EGD and colonoscopy after he is transfused and stable,  if both negative then would do small bowel capsule study.  Will follow with you.  Electronic Signatures: Scot JunElliott, Robert T (MD)  (Signed on 05-Oct-13 10:54)  Authored  Last Updated: 05-Oct-13 10:54 by Scot JunElliott, Robert T (MD)

## 2015-04-09 NOTE — Discharge Summary (Signed)
PATIENT NAME:  [name], Jake Griffin MR#:  [id number] DATE OF BIRTH:  07/15/1927  DATE OF ADMJacques EarthlySSION:  08/30/2012 DATE OF DISCHARGE:  08/31/2012  PRIMARY CARE PHYSICIAN: Dr. Evalyn Cascohristina Gonzales   DISCHARGE DIAGNOSES:  1. Stable angina.  2. Hypertension.  3. Hyperlipidemia.   HISTORY: This is an 81 year old with progressive episodes of chest pain, shortness of breath consistent with angina who has an abnormal stress test. He underwent cardiac catheterization showing significant stenosis of circumflex obtuse marginal artery as well as right coronary artery. He underwent PCI and stent placement drug-eluting stent of the circumflex obtuse marginal artery without complication. The patient would be medically managed for his right coronary artery. His hypertension, hyperlipidemia were under good control. The patient had no evidence of other symptoms and had reached his maximal hospital benefit. He was discharged in good condition.   DISCHARGE MEDICATIONS:  1. Aspirin 81 mg p.o. daily.  2. Plavix 75 mg p.o. daily.  3. Carvedilol 12.5 mg b.i.d.  4. Finasteride 5 mg each day.  5. Furosemide 40 mg b.i.d.  6. Nexium 40 mg p.o. daily.  7. Micardis with hydrochlorothiazide 80/12.5 mg p.o. daily.  8. Alprazolam as needed.   FOLLOW UP: He is to follow up in two weeks with Dr. Gwen PoundsKowalski.  ____________________________ Lamar BlinksBruce J. Kassady Laboy, MD bjk:cms D: 08/31/2012 08:02:39 ET T: 08/31/2012 11:49:41 ET  JOB#: [id number] cc: Lamar BlinksBruce J. Alyna Stensland, MD, <Dictator> Lamar BlinksBRUCE J Orvile Corona MD ELECTRONICALLY SIGNED 09/02/2012 9:11

## 2015-04-09 NOTE — Consult Note (Signed)
PATIENT NAME:  Jake Griffin, Jake Griffin MR#:  [id number] DATE OF BIRTH:  12-05-1927  DATE OF CONSULTATION:  09/24/2012  REFERRING PHYSICIAN:   CONSULTING PHYSICIAN:  Scot Jun, MD  HISTORY OF PRESENT ILLNESS: Patient is an 81 year old black male who underwent catheterization on 09/11, found to have significant coronary artery blockage, had two drug-eluting stents placed on 09/11. He did well and then recently had onset of shortness of breath with exertion, chest pain with exertion and came to the ER and was found to have severe hypochromic microcytic anemia and was admitted to the hospital. I was asked to see him in consultation.   Patient had a recent history of a prostate infection, has been treated with Bactrim for a week. In the ER he had a hemoglobin of 4.9. Since admission he has been transfused with 2 units of blood and he says he feels significantly better.   Patient supposedly had a colonoscopy a couple of years ago and an endoscopy as, supposedly these are normal. I do not have any of his records. He was told his prostate was enlarged and he may have had prostatitis. He does have a history of anemia in the past.   REVIEW OF SYSTEMS: No fever. No change in appetite. No weight loss. No asthma, wheezing, or emphysema. No chronic obstructive pulmonary disease. No chest pain since he has been admitted and had been transfused with 2 units of blood. No nausea, vomiting. No melena. No hematochezia. The review of systems obtained by the hospitalist was very thorough and this was reviewed.   PAST MEDICAL HISTORY:  1. Coronary artery disease with drug-eluting stents a month ago. 2. Stable angina. 3. Hypertension. 4. Dyslipidemia. 5. Benign prostatic hypertrophy with recent infection. 6. Gastroesophageal reflux disease.    ALLERGIES: No known drug allergies.    PAST SURGICAL HISTORY:  1. Back surgery due to ruptured disk x2.  2. Knee surgery to bone spur.   FAMILY HISTORY: Father died of a  surgical procedure on his intestines. Mother died of "old age".    HABITS: Patient smoked for 10 or 15 years, quit in 1956. Does not drink alcohol. Retired Curator, Engineer, manufacturing. Lives with his wife.   MEDICATIONS ON ADMISSION:  1. Senna 1 p.o. b.i.d.  2. Protonix 40 mg a day. 3. Proscar 5 mg a day.  4. Plavix 75 mg a day.  5. Micardis with HCTZ 80/12.5, 1 a day. 6. Lyrica 100 mg t.i.d.  7. Lipitor 80 mg a day.  8. Imdur 30 mg a day.  9. Furosemide 40 mg b.i.d.  10. Centrum Silver once a day.  11. Coreg 12.5 mg b.i.d.  12. Bactrim DS b.i.d.  13. Aspirin 81 mg a day.  14. Alprazolam 0.5 mg a day.   PHYSICAL EXAMINATION:  GENERAL: Elderly black male in no acute distress. Appropriate historian, cooperative, pleasant.  VITAL SIGNS: Temperature 98.1, pulse 60, respirations 16, blood pressure 102/52.   HEENT: Sclerae nonicteric. Conjunctivae pale. Tongue is somewhat pale. The head is atraumatic. Trachea is in the midline.   CHEST: Clear to auscultation.   HEART: 1/6 systolic murmur.   ABDOMEN: Bowel sounds present. No hepatosplenomegaly. No masses. No bruits. No significant tenderness.   RECTAL: Darkish brown stool, heme positive, not floridly so and there is no maroon or true black stool present.   LABORATORY, DIAGNOSTIC AND RADIOLOGICAL DATA: BUN 29, creatinine 2.09, sodium 142, potassium 4.2, chloride 108, CO2 23, calcium 8.4, ferritin 7, iron 16, both low. Iron saturation  4%, which is low. Total protein 7, albumin 3.2, total bilirubin 0.7, alkaline phosphatase 43, SGOT 15, SGPT 13, troponin less than 0.02, CPK-MB is negative. Hemoglobin on admission 4.9, after 2 units 6.9. Platelet count 188, MCV 67, MCH 21 on admission. He has O+ blood with a negative antibody screen. Pro time 15, INR 1.1. Urinalysis unremarkable. G6 PD measurement is somewhat low at 2.43 with low normal being 4.14. A portable view of the chest shows poor respiratory effort, mild cardiomegaly, mild atelectasis  lung bases.   ASSESSMENT: He has significant hypochromic microcytic indices along with significant anemia and heme-positive stool. He supposedly had a colonoscopy and an upper endoscopy two years ago that was not done here, cannot access any records of that at this time. He does have a G6 PD deficiency. He had been on Septra for a week. His bilirubin is not elevated. Haptoglobin is pending. He clearly has iron deficiency anemia. It is possible he has G6 PD induced anemia as well.   Protecting his stent his month old stents is of paramount importance at this time therefore I would restart the Plavix immediately. Aspirin can be held per discussion with Dr. Juliann Paresallwood but we will restart the Plavix. Because of his coronary artery disease and his anemia he will need to be transfused up at least a couple of more units to bring him into 9 to 10 range hemoglobin. Plan to do an upper endoscopy followed by a colonoscopy because of his iron deficiency anemia and heme-positive stool but I would like to have him transfused and stabilized first since he is not in any distress form any GI bleeding. I will change his diet to full liquid from a 2 gram sodium because of possibility of upper GI tract disease. It is possible that he could have small bowel AVM since he had an upper and a colon two years ago for iron deficiency anemia that did not show anything. I will follow with you.   ____________________________ Scot Junobert T. Elliott, MD rte:cms D: 09/24/2012 10:47:24 ET T: 09/24/2012 11:08:34 ET  JOB#: [id number] cc: Lamar BlinksBruce J. Kowalski, MD Dwayne D. Juliann Paresallwood, MD Scot JunOBERT T ELLIOTT MD ELECTRONICALLY SIGNED 10/01/2012 11:29

## 2015-04-09 NOTE — Consult Note (Signed)
CC: anemia and heme pos stool.  Pt EGD did not show any possible bleeding site.  Will need to do Colonoscopy tomorrow.  Prep this evening.  Hgb stable.  Electronic Signatures: Scot JunElliott, Robert T (MD)  (Signed on 07-Oct-13 12:58)  Authored  Last Updated: 07-Oct-13 12:58 by Scot JunElliott, Robert T (MD)

## 2015-04-09 NOTE — Consult Note (Signed)
PATIENT NAME:  Jake Griffin, Jake Griffin MR#:  [id number] DATE OF BIRTH:  04-23-1927  DATE OF CONSULTATION:  09/24/2012  REFERRING PHYSICIAN:   CONSULTING PHYSICIAN:  Scot Junobert T. Charmayne Odell, MD  ADDENDUM:  The patient did complain that sometimes food hangs up in the lower esophagus when he swallows. He has not had to vomit the food out and he has had no hematemesis.     ____________________________ Scot Junobert T. Shalayna Ornstein, MD rte:bjt D: 09/24/2012 10:57:52 ET T: 09/24/2012 11:04:15 ET JOB#: [id number]  cc: Scot Junobert T. Grafton Warzecha, MD, <Dictator> Scot JunOBERT T Noelie Renfrow MD ELECTRONICALLY SIGNED 10/01/2012 11:29

## 2015-07-24 ENCOUNTER — Encounter (INDEPENDENT_AMBULATORY_CARE_PROVIDER_SITE_OTHER): Payer: Self-pay

## 2015-07-24 ENCOUNTER — Ambulatory Visit (INDEPENDENT_AMBULATORY_CARE_PROVIDER_SITE_OTHER): Payer: Medicare Other | Admitting: Nurse Practitioner

## 2015-07-24 ENCOUNTER — Encounter: Payer: Self-pay | Admitting: Nurse Practitioner

## 2015-07-24 VITALS — BP 150/80 | HR 82 | Temp 98.2°F | Resp 18 | Ht 66.0 in | Wt 147.8 lb

## 2015-07-24 DIAGNOSIS — Z7189 Other specified counseling: Secondary | ICD-10-CM

## 2015-07-24 DIAGNOSIS — F0280 Dementia in other diseases classified elsewhere without behavioral disturbance: Secondary | ICD-10-CM | POA: Diagnosis not present

## 2015-07-24 DIAGNOSIS — N400 Enlarged prostate without lower urinary tract symptoms: Secondary | ICD-10-CM | POA: Diagnosis not present

## 2015-07-24 DIAGNOSIS — G3183 Dementia with Lewy bodies: Secondary | ICD-10-CM

## 2015-07-24 DIAGNOSIS — Z7689 Persons encountering health services in other specified circumstances: Secondary | ICD-10-CM

## 2015-07-24 NOTE — Patient Instructions (Signed)
Welcome to [facility]! See you in 1 month.

## 2015-07-24 NOTE — Progress Notes (Signed)
Subjective:    Patient ID: Jake Griffin, male    DOB: 07/23/1927, 81 y.o.   MRN: [id number]  HPI  Jake Griffin is a 81 yo male establishing Griffin today and no CC.   Son with patient today and is a great adjunct historian and helps with Jake Griffin.   1) New pt info:   Immunizations- 7 years approximately   Colonoscopy- Regularly per son   PSA- 13.58 in Jan. 2016   Eye Exam- No recent eye exam  Dental Exam- Not UTD on cleanings, had tooth pulled recently per son.  2) Chronic Problems-  Dementia- Sees Neurology, started on Aricept recently and son reports seeing a slight improvement. Son reports Jake Griffin is confused about medications that are mailed to home and puts old bottles up in cabinets with leftover medication, agitated in the mornings occasionally, pt is aware of declining memory and is looking for resources to limit driving.   Prostate- Seeing Urology ([facility])  RBBB- Last EKG July 2014, pt forgot to follow up with Dr. Elease Hashimoto 6 months after last appointment in Dec. 2014. Encouraged son to make appointment to check on cardiac issues.    3) Acute Problems- None today. Pt wants to make sure I am aware of home dynamics to help in the future. He is the primary caretaker of Jake wife. He reports Jake wife has had a hx of "worrying him to death". He reports going to Jake shed in the back yard to escape sometimes.   I personally reviewed the follow imaging-   CT abdomen pelvis w/ contrast- 04/09/15   Bilateral renal cysts, bladder outlet obstruction likely, prostatomegaly w/ BPH, diverticulosis, backed up stool.   [facility]- safety evaluation recently in home Cardiology- Dr. Elease Hashimoto Urology- [facility] (unsure of name of doctor) Neurology- Dr. Marjory Lies   Review of Systems  Constitutional: Negative for fever, chills, diaphoresis and fatigue.  Eyes: Negative for visual disturbance.  Respiratory: Negative for chest tightness, shortness of breath and wheezing.    Cardiovascular: Negative for chest pain, palpitations and leg swelling.  Gastrointestinal: Negative for nausea, vomiting, diarrhea and constipation.  Neurological: Negative for dizziness, weakness and numbness.  Psychiatric/Behavioral: Positive for confusion. Negative for behavioral problems, decreased concentration and agitation. The patient is nervous/anxious.        Reports occasional anxiety when wife is needing a lot of help   Past Medical History  Diagnosis Date  . HTN (hypertension)   . Right carotid bruit   . Hypercholesteremia   . RBBB   . GERD (gastroesophageal reflux disease)   . Iron deficiency anemia   . Arthritis   . Frequent headaches   . History of stomach ulcers   . Urine incontinence     History   Social History  . Marital Status: Married    Spouse Name: Bard Herbert  . Number of Children: 1  . Years of Education: HS   Occupational History  . retired    Social History Main Topics  . Smoking status: Former Smoker -- 0.50 packs/day for 5 years    Quit date: 12/21/1961  . Smokeless tobacco: Never Used  . Alcohol Use: No  . Drug Use: No  . Sexual Activity: Not on file   Other Topics Concern  . Not on file   Social History Narrative   Patient lives at home with spouse.   Caffeine use: 1 cup daily    Past Surgical History  Procedure Laterality Date  . Inguinal  hernia repair    . Lumbar spine surgery      x2  . Hemorroidectomy    . Back surgery  1996    Family History  Problem Relation Age of Onset  . Stroke    . Diabetes    . Aneurysm    . Breast cancer    . Hyperlipidemia    . Heart attack Mother   . Other Jake Griffin     complication of surgery    No Known Allergies  Current Outpatient Prescriptions on File Prior to Visit  Medication Sig Dispense Refill  . acetaminophen (TYLENOL) 500 MG tablet Take 1 tablet (500 mg total) by mouth every 6 (six) hours as needed for mild pain or moderate pain. 30 tablet 0  . amLODipine (NORVASC) 10 MG tablet  Take 1 tablet by mouth daily.    Marland Kitchen atorvastatin (LIPITOR) 40 MG tablet Take 40 mg by mouth every morning.     . diltiazem (CARDIZEM CD) 120 MG 24 hr capsule Take 1 capsule by mouth daily.     Marland Kitchen HYDROcodone-acetaminophen (NORCO/VICODIN) 5-325 MG per tablet Take 0.5 tablets by mouth every 4 (four) hours as needed for moderate pain. (Patient not taking: Reported on 07/24/2015) 20 tablet 0   No current facility-administered medications on file prior to visit.       Objective:   Physical Exam  Constitutional: He is oriented to person, place, and time. He appears well-developed and well-nourished. No distress.  BP 150/80 mmHg  Pulse 82  Temp(Src) 98.2 F (36.8 C)  Resp 18  Ht  (1.676 m)  Wt 147 lb 12.8 oz (67.042 kg)  BMI 23.87 kg/m2  SpO2 98%   HENT:  Head: Normocephalic and atraumatic.  Right Ear: External ear normal.  Left Ear: External ear normal.  Cardiovascular: Normal rate and regular rhythm.   Pulmonary/Chest: Effort normal and breath sounds normal. No respiratory distress. He has no wheezes. He has no rales. He exhibits no tenderness.  Musculoskeletal:  Stooped posture  Neurological: He is alert and oriented to person, place, and time.  Skin: Skin is warm and dry. No rash noted. He is not diaphoretic.  Psychiatric: He has a normal mood and affect. Jake behavior is normal. Judgment and thought content normal.      Assessment & Plan:

## 2015-07-29 DIAGNOSIS — Z7689 Persons encountering health services in other specified circumstances: Secondary | ICD-10-CM | POA: Insufficient documentation

## 2015-07-29 DIAGNOSIS — N400 Enlarged prostate without lower urinary tract symptoms: Secondary | ICD-10-CM | POA: Insufficient documentation

## 2015-07-29 NOTE — Assessment & Plan Note (Signed)
Discussed acute and chronic issues. Reviewed health maintenance measures, PFSHx, and immunizations. Obtain records from previous facility.   

## 2015-07-29 NOTE — Assessment & Plan Note (Signed)
Pt was started on Aricept by Neurology. Will follow. Pt and wife both need home care.

## 2015-07-29 NOTE — Assessment & Plan Note (Signed)
Pt was seen by Urology- [facility]. Stable currently, no medications on board. Pt worked up for prostate cancer- per pt negative.

## 2015-09-11 IMAGING — US US SOFT TISSUE HEAD/NECK
1 series · 14 of 25 positions shown · non-contrast
Comparison: none

[Series 1: us soft tissue head/neck · 0.06mm/px · 14 of 64 slices shown]
[im 1/64]
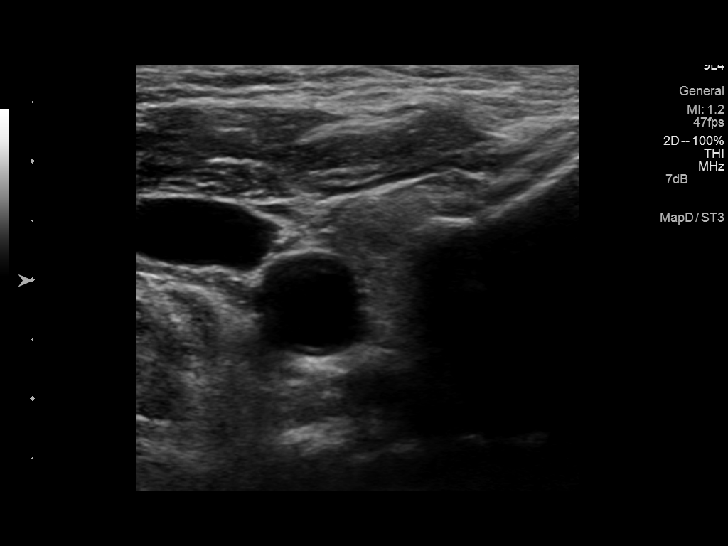
[im 6/64]
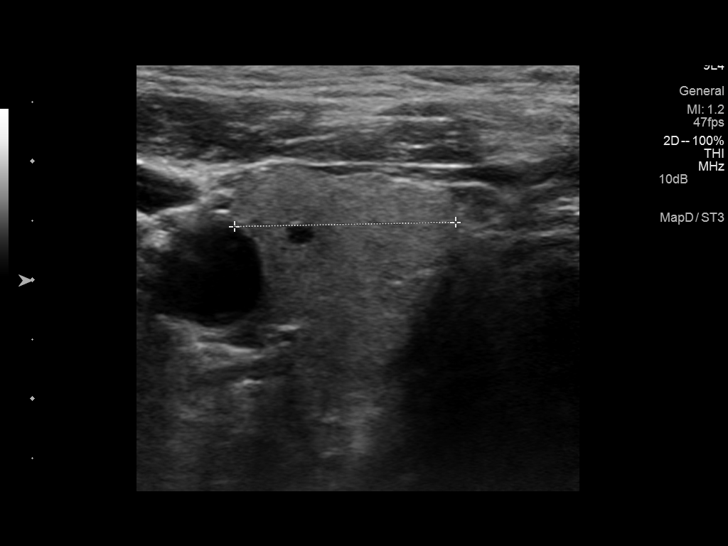
[im 11/64]
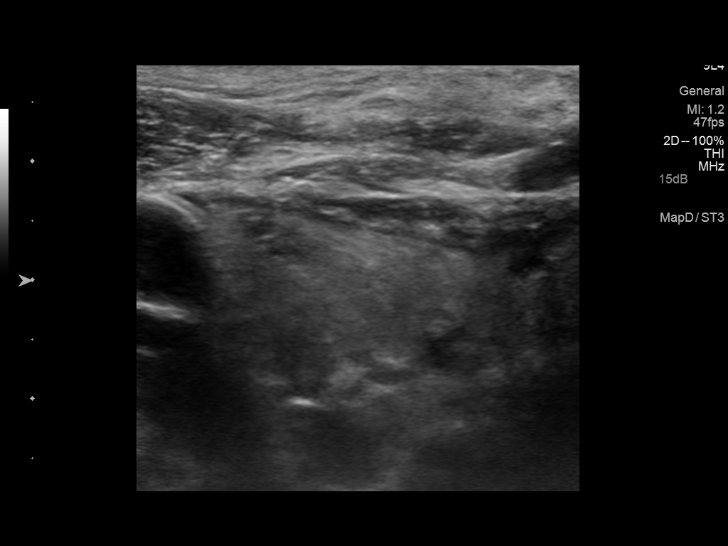
[im 16/64]
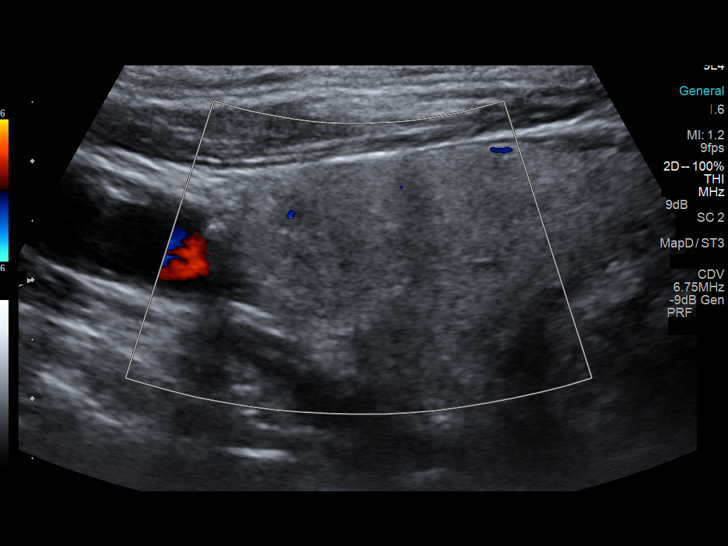
[im 22/64]
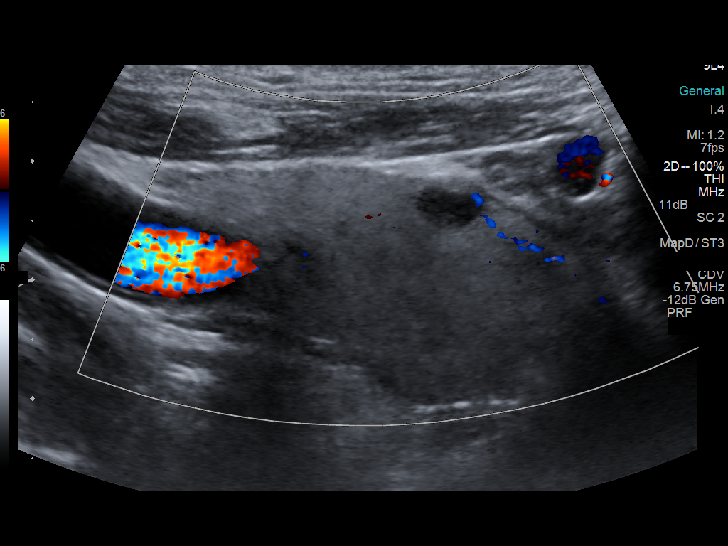
[im 24/64]
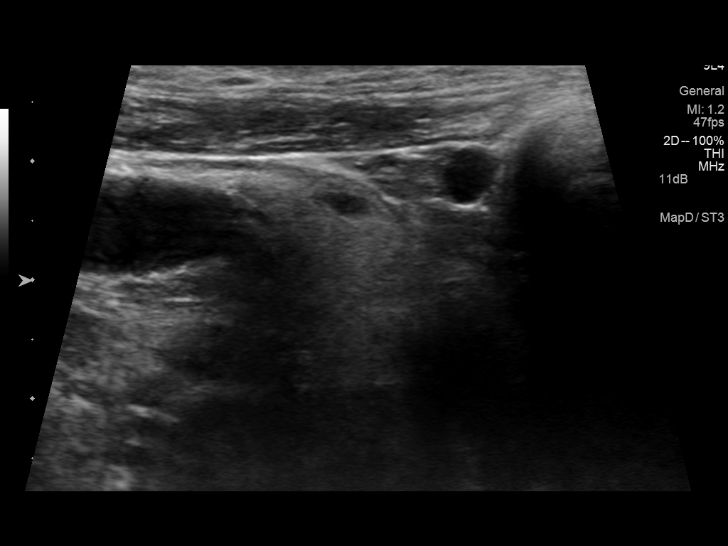
[im 29/64]
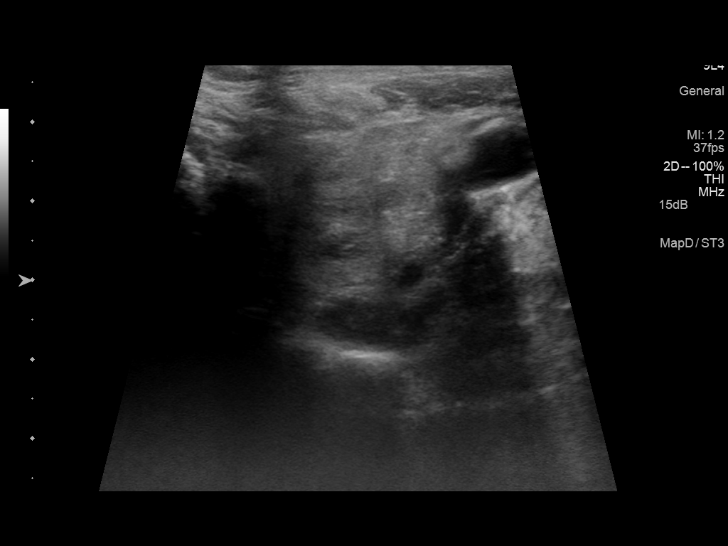
[im 35/64]
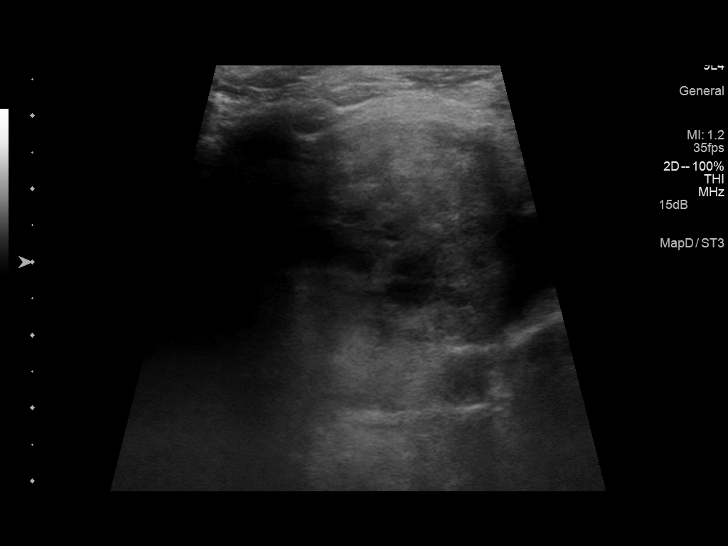
[im 40/64]
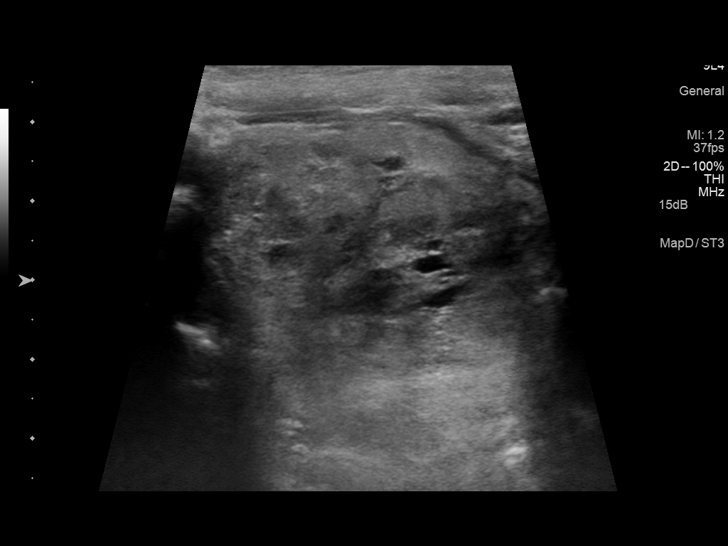
[im 43/64]
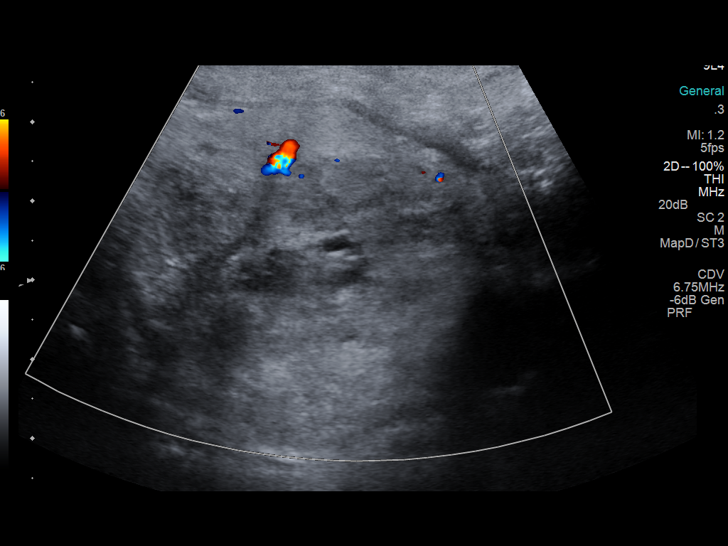
[im 48/64]
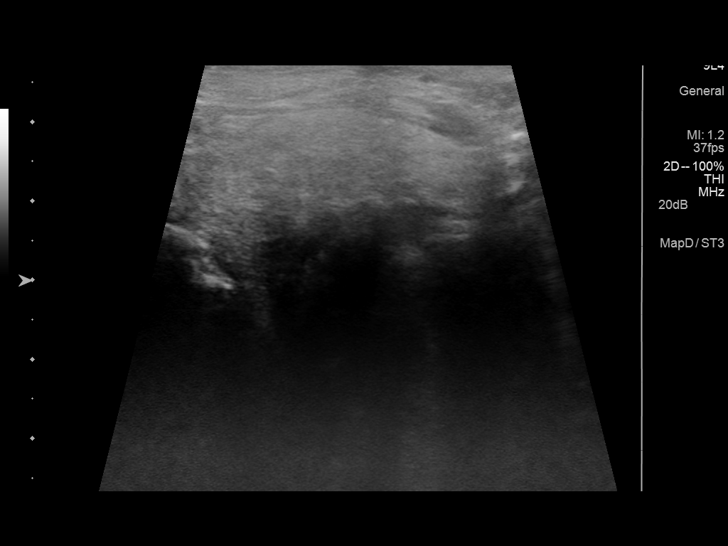
[im 53/64]
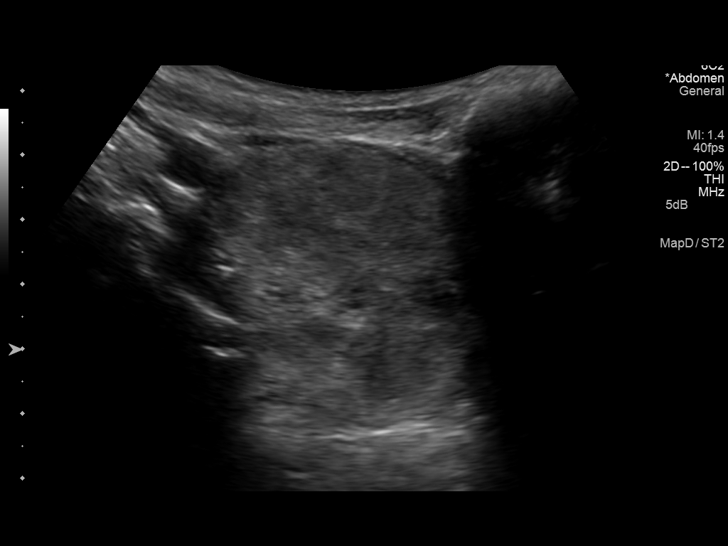
[im 58/64]
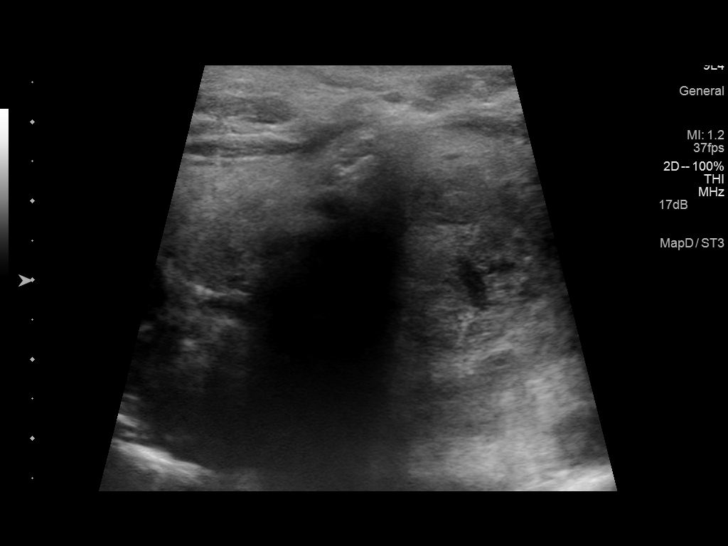
[im 64/64]
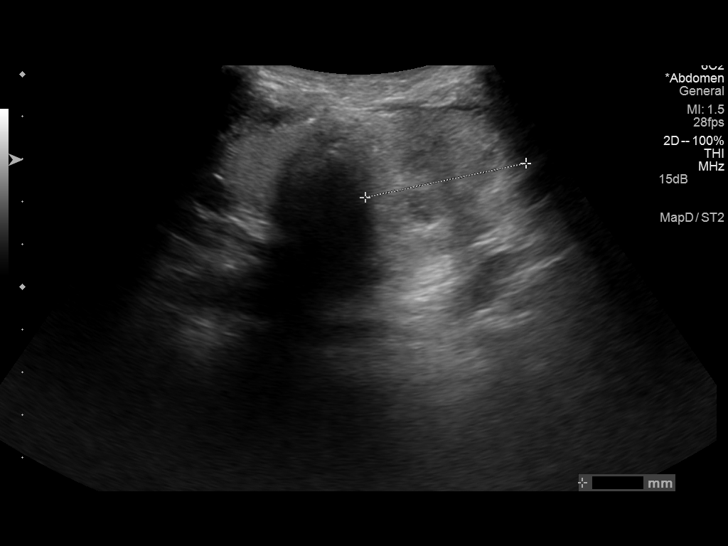

[14 of 25 positions shown; findings below may reference images not displayed]

CLINICAL DATA
Nontoxic multinodular goiter.  Mass on prior chest CT.

EXAM
THYROID ULTRASOUND

TECHNIQUE
Ultrasound examination of the thyroid gland and adjacent soft
tissues was performed.

COMPARISON
CT CHEST W/O CM dated 01/31/2014

FINDINGS
Right thyroid lobe

Measurements: 5.0 x 2.6 x 1.9 cm. 5 mm solid hypoechoic nodule in
the upper pole. 6 mm and 3 mm cystic nodules in the mid and lower
pole.

Left thyroid lobe

Measurements: 5.4 x 3.9 x 5.6 cm. Complex solid mass replacing much
of the mid and lower pole of the left thyroid lobe, measuring 4.9 x
5.5 x 3.7 cm.

Isthmus

Thickness: 9 mm.  No nodules visualized.

Lymphadenopathy

None visualized.

IMPRESSION
Bilateral thyroid nodules with a dominant solid nodule in the left
thyroid lobe. Findings meet consensus criteria for biopsy.
Ultrasound-guided fine needle aspiration should be considered, as
per the consensus statement: Management of Thyroid Nodules Detected
at US: Society of Radiologists in Ultrasound Consensus Conference

SIGNATURE

## 2015-09-30 ENCOUNTER — Ambulatory Visit: Payer: Medicare Other | Admitting: Family Medicine

## 2016-02-03 ENCOUNTER — Ambulatory Visit: Payer: Medicare Other | Admitting: Sports Medicine

## 2016-02-13 ENCOUNTER — Ambulatory Visit: Payer: Medicare Other | Admitting: Nurse Practitioner

## 2016-03-09 ENCOUNTER — Ambulatory Visit: Payer: Medicare Other | Admitting: Family Medicine

## 2016-03-20 ENCOUNTER — Telehealth: Payer: Self-pay | Admitting: Nurse Practitioner

## 2016-03-20 ENCOUNTER — Other Ambulatory Visit: Payer: Self-pay | Admitting: Nurse Practitioner

## 2016-03-20 DIAGNOSIS — F0280 Dementia in other diseases classified elsewhere without behavioral disturbance: Secondary | ICD-10-CM

## 2016-03-20 DIAGNOSIS — G3183 Dementia with Lewy bodies: Principal | ICD-10-CM

## 2016-03-20 NOTE — Telephone Encounter (Signed)
Thanks; ordered

## 2016-03-20 NOTE — Telephone Encounter (Signed)
Thalia PartyKari Danforth has sent me a message on another patients referral about this pt. She wants to know if you can put [facility] for medication management and memory care.

## 2016-03-23 ENCOUNTER — Ambulatory Visit: Payer: Medicare Other | Admitting: Nurse Practitioner

## 2016-03-26 ENCOUNTER — Encounter: Payer: Self-pay | Admitting: Nurse Practitioner

## 2016-03-26 ENCOUNTER — Ambulatory Visit (INDEPENDENT_AMBULATORY_CARE_PROVIDER_SITE_OTHER): Payer: Medicare Other | Admitting: Nurse Practitioner

## 2016-03-26 VITALS — BP 144/58 | HR 78 | Temp 97.7°F | Ht 66.0 in | Wt 143.2 lb

## 2016-03-26 DIAGNOSIS — B351 Tinea unguium: Secondary | ICD-10-CM

## 2016-03-26 DIAGNOSIS — F0390 Unspecified dementia without behavioral disturbance: Secondary | ICD-10-CM

## 2016-03-26 DIAGNOSIS — Z111 Encounter for screening for respiratory tuberculosis: Secondary | ICD-10-CM

## 2016-03-26 MED ORDER — AMLODIPINE BESYLATE 10 MG PO TABS: 10.0000 mg | ORAL_TABLET | Freq: Every day | ORAL | Status: AC

## 2016-03-26 MED ORDER — DILTIAZEM HCL ER COATED BEADS 120 MG PO CP24: 120.0000 mg | ORAL_CAPSULE | Freq: Every day | ORAL | Status: AC

## 2016-03-26 NOTE — Progress Notes (Signed)
Pre visit review using our clinic review tool, if applicable. No additional management support is needed unless otherwise documented below in the visit note. 

## 2016-03-26 NOTE — Progress Notes (Signed)
Patient ID: Jake Griffin, male    DOB: [birth year]  Age: 82 y.o. MRN: [id number]  CC: Follow-up   HPI Jake Griffin presents for follow up and going to Assisted living- Needs FL2 forms.   Jake Griffin was seen by Neurology for Moderate dementia on 01/04/16- doubtful of lewy body dementia.   FL2 form to fax: Ms. Jake Griffin- [facility]   [address]  [place] Location [address] [place], [place] [id number]  Audiology appointment for hearing aid   Getting TB testing today- Thalia PartyKari Danforth reports they will get it read on Sat. By the home health staff.   Right great toe- fungal changes and pt reports it feels loose.  History Jake MuttersRoy has a past medical history of HTN (hypertension); Right carotid bruit; Hypercholesteremia; RBBB; GERD (gastroesophageal reflux disease); Iron deficiency anemia; Arthritis; Frequent headaches; History of stomach ulcers; and Urine incontinence.   He has past surgical history that includes Inguinal hernia repair; Lumbar spine surgery; Hemorroidectomy; and Back surgery (1996).   His family history includes Heart attack in his mother; Other in his father.He reports that he quit smoking about 54 years ago. He has never used smokeless tobacco. He reports that he does not drink alcohol or use illicit drugs.  Outpatient Prescriptions Prior to Visit  Medication Sig Dispense Refill  . acetaminophen (TYLENOL) 500 MG tablet Take 1 tablet (500 mg total) by mouth every 6 (six) hours as needed for mild pain or moderate pain. 30 tablet 0  . amLODipine (NORVASC) 10 MG tablet Take 1 tablet by mouth daily.    Marland Kitchen. atorvastatin (LIPITOR) 40 MG tablet Take 40 mg by mouth every morning.     . diltiazem (CARDIZEM CD) 120 MG 24 hr capsule Take 1 capsule by mouth daily.     Marland Kitchen. donepezil (ARICEPT) 10 MG tablet Take 10 mg by mouth at bedtime.    Marland Kitchen. HYDROcodone-acetaminophen (NORCO/VICODIN) 5-325 MG per tablet Take 0.5 tablets by mouth every 4 (four) hours as needed for moderate pain. 20  tablet 0   No facility-administered medications prior to visit.    ROS Review of Systems  Constitutional: Negative for fever, chills, diaphoresis and fatigue.  HENT: Positive for hearing loss.   Eyes: Negative for visual disturbance.  Respiratory: Negative for cough, chest tightness and wheezing.   Cardiovascular: Negative for chest pain, palpitations and leg swelling.  Gastrointestinal: Negative for nausea, vomiting and diarrhea.  Musculoskeletal: Negative for gait problem.  Psychiatric/Behavioral: Positive for confusion. Negative for suicidal ideas and sleep disturbance. The patient is not nervous/anxious.     Objective:  BP 144/58 mmHg  Pulse 78  Temp(Src) 97.7 F (36.5 C) (Oral)  Ht 5\' 6"  (1.676 m)  Wt 143 lb 4 oz (64.978 kg)  BMI 23.13 kg/m2  SpO2 98%  Physical Exam  Constitutional: He is oriented to person, place, and time. He appears well-developed and well-nourished. No distress.  HENT:  Head: Normocephalic and atraumatic.  Right Ear: External ear normal.  Left Ear: External ear normal.  Mouth/Throat: Oropharynx is clear and moist.  HOH  Eyes: EOM are normal. Pupils are equal, round, and reactive to light. Right eye exhibits no discharge. Left eye exhibits no discharge. No scleral icterus.  Cardiovascular: Normal rate, regular rhythm and intact distal pulses.  Exam reveals no gallop and no friction rub.   Murmur heard. Pulmonary/Chest: Effort normal and breath sounds normal. No respiratory distress. He has no wheezes. He has no rales. He exhibits no tenderness.  Neurological: He is alert and oriented to person, place, and time.  Skin: Skin is warm and dry. No rash noted. He is not diaphoretic.  Right foot- great toenail, large deformed with fungal changes or just hypertrophied.   Psychiatric: He has a normal mood and affect. His behavior is normal. Judgment and thought content normal.      Assessment & Plan:   Shiheem was seen today for follow-up.  Diagnoses and  all orders for this visit:  Visit for TB skin test -     TB Skin Test  Dementia, without behavioral disturbance  Fungal nail infection -     Ambulatory referral to Podiatry  Other orders -     amLODipine (NORVASC) 10 MG tablet; Take 1 tablet (10 mg total) by mouth daily. -     diltiazem (CARDIZEM CD) 120 MG 24 hr capsule; Take 1 capsule (120 mg total) by mouth daily.   I have discontinued Jake Griffin's atorvastatin, acetaminophen, amLODipine, HYDROcodone-acetaminophen, and donepezil. I have also changed his amLODipine and diltiazem. Additionally, I am having him maintain his memantine.  Meds ordered this encounter  Medications  . DISCONTD: amLODipine (NORVASC) 10 MG tablet    Sig: Take 10 mg by mouth daily.  . memantine (NAMENDA) 5 MG tablet    Sig: Take 1 tablet by mouth daily.  Marland Kitchen amLODipine (NORVASC) 10 MG tablet    Sig: Take 1 tablet (10 mg total) by mouth daily.    Dispense:  30 tablet    Refill:  11    Order Specific Question:  Supervising Provider    Answer:  Duncan Dull L [[id number]]  . diltiazem (CARDIZEM CD) 120 MG 24 hr capsule    Sig: Take 1 capsule (120 mg total) by mouth daily.    Dispense:  30 capsule    Refill:  11    Order Specific Question:  Supervising Provider    Answer:  Sherlene Shams [[id number]]     Follow-up: Return if symptoms worsen or fail to improve.

## 2016-03-26 NOTE — Patient Instructions (Signed)
You had your TB (tuberculin) Skin test today  It needs to be read Saturday after 4 pm or Sunday before 4 pm (April 8th/April 9th)   

## 2016-03-30 ENCOUNTER — Telehealth: Payer: Self-pay | Admitting: Nurse Practitioner

## 2016-03-30 NOTE — Telephone Encounter (Signed)
Pt son Jay Schlichterlmer stating the [facility] facility have not received the Fl2 please refax to [phone].  Call son @ [phone]. Thank you!

## 2016-03-30 NOTE — Telephone Encounter (Signed)
Spoke with the son, he verbalized understanding and was fine with that outcome.  He had an additional question regarding the TB skin tests.  Both parents received it on Thursday and he is going to find out if they had it read on Saturday or not. If not not I explained that they will need to return to have it redone and read for proper procedures. Thanks

## 2016-03-30 NOTE — Telephone Encounter (Signed)
I had just received this on Friday afternoon- after it had been sent with no name nor DOB on the form. I will have this completed by Wednesday at 5 pm.

## 2016-03-30 NOTE — Telephone Encounter (Signed)
Jake Griffin, was this FL2 completed also? If so, is it in the faxed pile at your CMA station? Thanks

## 2016-03-31 DIAGNOSIS — B351 Tinea unguium: Secondary | ICD-10-CM | POA: Insufficient documentation

## 2016-03-31 DIAGNOSIS — Z111 Encounter for screening for respiratory tuberculosis: Secondary | ICD-10-CM | POA: Insufficient documentation

## 2016-03-31 NOTE — Assessment & Plan Note (Signed)
Pt is very HOH and getting a consultation for hearing aides soon. Could play into the dementia factors. Pt is on Namenda 5 mg daily switched from Aricept by Neurology. Seeing neurology

## 2016-03-31 NOTE — Assessment & Plan Note (Signed)
[facility] [place] if they both agree on it (they both said yes in the office). Thalia PartyKari Danforth requested the TB test for Mr. And Mrs. [place]ham to be read by staff RNs this weekend.

## 2016-03-31 NOTE — Assessment & Plan Note (Signed)
Right great toenail fungal changes over a long period of time  New problem to me  Would like referral to [facility] where wife goes.  The toenail appears to be lose and may need to be removed

## 2016-04-01 NOTE — Telephone Encounter (Signed)
Pt's son advised of same

## 2016-04-01 NOTE — Telephone Encounter (Signed)
Paperwork completed and faxed from [facility]

## 2016-04-06 NOTE — Telephone Encounter (Signed)
The patient's son Jay Schlichterlmer called today stating that [facility] and if it could be refaxed.

## 2016-04-06 NOTE — Telephone Encounter (Signed)
Please advise that paperwork was not received at the facility, is [facility] and can it be refaxed? thanks

## 2016-04-06 NOTE — Telephone Encounter (Signed)
Handed to Tanya to refax

## 2016-04-08 ENCOUNTER — Telehealth: Payer: Self-pay | Admitting: Nurse Practitioner

## 2016-04-08 NOTE — Telephone Encounter (Signed)
Jake Griffin from [facility] received an order for this patient's Diltiazem. She is wanting to know should it be extended release or not . The patient has not had his medication this morning needing to know ASAP.

## 2016-04-08 NOTE — Telephone Encounter (Signed)
Spoke with Larita FifeLynn at [facility], clarified the order.

## 2016-04-13 ENCOUNTER — Telehealth: Payer: Self-pay | Admitting: Nurse Practitioner

## 2016-04-13 NOTE — Telephone Encounter (Signed)
[facility] dropped off FL2 forms for Jake Griffin to complete. Forms are in a brown envelope in [name]'s box.

## 2018-02-16 ENCOUNTER — Emergency Department: Payer: Medicare Other

## 2018-02-16 ENCOUNTER — Encounter: Payer: Self-pay | Admitting: Emergency Medicine

## 2018-02-16 ENCOUNTER — Emergency Department
Admission: EM | Admit: 2018-02-16 | Discharge: 2018-02-16 | Disposition: A | Discharge status: Home / Self Care | Payer: Medicare Other | Attending: Emergency Medicine | Admitting: Emergency Medicine

## 2018-02-16 ENCOUNTER — Other Ambulatory Visit: Payer: Self-pay

## 2018-02-16 DIAGNOSIS — R112 Nausea with vomiting, unspecified: Secondary | ICD-10-CM | POA: Insufficient documentation

## 2018-02-16 DIAGNOSIS — I10 Essential (primary) hypertension: Secondary | ICD-10-CM | POA: Insufficient documentation

## 2018-02-16 DIAGNOSIS — Z87891 Personal history of nicotine dependence: Secondary | ICD-10-CM | POA: Insufficient documentation

## 2018-02-16 DIAGNOSIS — Z79899 Other long term (current) drug therapy: Secondary | ICD-10-CM | POA: Insufficient documentation

## 2018-02-16 LAB — COMPREHENSIVE METABOLIC PANEL
ALT: 18 U/L (ref 17–63)
AST: 34 U/L (ref 15–41)
Albumin: 4.6 g/dL (ref 3.5–5.0)
Alkaline Phosphatase: 72 U/L (ref 38–126)
Anion gap: 10 (ref 5–15)
BUN: 25 mg/dL — ABNORMAL HIGH (ref 6–20)
CO2: 22 mmol/L (ref 22–32)
Calcium: 9.5 mg/dL (ref 8.9–10.3)
Chloride: 105 mmol/L (ref 101–111)
Creatinine, Ser: 1.55 mg/dL — ABNORMAL HIGH (ref 0.61–1.24)
GFR calc Af Amer: 44 mL/min — ABNORMAL LOW (ref >60)
GFR calc non Af Amer: 38 mL/min — ABNORMAL LOW (ref >60)
GLUCOSE: 110 mg/dL — AB (ref 65–99)
Potassium: 4.7 mmol/L (ref 3.5–5.1)
Sodium: 137 mmol/L (ref 135–145)
Total Bilirubin: 0.9 mg/dL (ref 0.3–1.2)
Total Protein: 8.6 g/dL — ABNORMAL HIGH (ref 6.5–8.1)

## 2018-02-16 LAB — CBC WITH DIFFERENTIAL/PLATELET
Basophils Absolute: 0 10*3/uL (ref 0–0.1)
Basophils Relative: 0 %
EOS ABS: 0 10*3/uL (ref 0–0.7)
Eosinophils Relative: 0 %
HEMATOCRIT: 47.8 % (ref 40.0–52.0)
Hemoglobin: 15.7 g/dL (ref 13.0–18.0)
Lymphocytes Relative: 3 %
Lymphs Abs: 0.4 10*3/uL — ABNORMAL LOW (ref 1.0–3.6)
MCH: 26.4 pg (ref 26.0–34.0)
MCHC: 32.8 g/dL (ref 32.0–36.0)
MCV: 80.6 fL (ref 80.0–100.0)
MONO ABS: 0.3 10*3/uL (ref 0.2–1.0)
Monocytes Relative: 3 %
NEUTROS ABS: 10 10*3/uL — AB (ref 1.4–6.5)
Neutrophils Relative %: 94 %
Platelets: 226 10*3/uL (ref 150–440)
RBC: 5.94 MIL/uL — ABNORMAL HIGH (ref 4.40–5.90)
RDW: 15.3 % — ABNORMAL HIGH (ref 11.5–14.5)
WBC: 10.7 10*3/uL — ABNORMAL HIGH (ref 3.8–10.6)

## 2018-02-16 LAB — TROPONIN I
TROPONIN I: 0.03 ng/mL — AB (ref <0.03)
Troponin I: 0.03 ng/mL (ref <0.03)

## 2018-02-16 LAB — LIPASE, BLOOD: LIPASE: 34 U/L (ref 11–51)

## 2018-02-16 MED ORDER — DILTIAZEM HCL 60 MG PO TABS
30.0000 mg | ORAL_TABLET | Freq: Once | ORAL | Status: AC
  Administered 2018-02-16: 30 mg via ORAL
  Filled 2018-02-16: qty 1

## 2018-02-16 MED ORDER — ONDANSETRON HCL 4 MG/2ML IJ SOLN
4.0000 mg | Freq: Once | INTRAMUSCULAR | Status: AC
  Administered 2018-02-16: 4 mg via INTRAVENOUS
  Filled 2018-02-16: qty 2

## 2018-02-16 MED ORDER — SODIUM CHLORIDE 0.9 % IV BOLUS (SEPSIS)
1000.0000 mL | Freq: Once | INTRAVENOUS | Status: AC
  Administered 2018-02-16: 1000 mL via INTRAVENOUS

## 2018-02-16 NOTE — ED Provider Notes (Signed)
Patient received in sign out from Dr. Pershing ProudSchaevitz.  Plan for repeat enzymes.  Patient noted to be a mild tachycardia but has not taken his home Cardizem therefore will give him a dose here.  Repeat troponin is negative.  Patient remains asymptomatic.  Patient and son feel comfortable taking patient back home for further workup as an outpatient.     Willy Eddyobinson, Zykerria Tanton, MD 02/16/18 [phone]

## 2018-02-16 NOTE — ED Notes (Signed)
Pt D/C into the care of his son. NAD noted at this time. Pt's son denies any comments/concerns at this time. Pt taken to lobby via wheelchair by his son.

## 2018-02-16 NOTE — ED Notes (Signed)
Pt resting in bed at this time with eyes closed. Respirations even and unlabored, skin warm, dry, and intact. TV noted to be on. Will continue to monitor for further patient needs.

## 2018-02-16 NOTE — ED Provider Notes (Signed)
[facility] Emergency Department Provider Note  ___________________________________________   First MD Initiated Contact with Patient 02/16/18 1150     (approximate)  I have reviewed the triage vital signs and the nursing notes.   HISTORY  Chief Complaint Nausea and Emesis   HPI Jake Griffin is a 84 y.o. male a history of GERD, hypertension and hyperlipidemia who is presenting to the emergency department for nausea and vomiting over the past 24 hours.  Patient is denying any pain.  Denies any diarrhea.  Says that he has vomited multiple times over the past 24 hours including after taking his medications this morning and after eating this morning.  He thinks that he may have food poisoning because he ate some potatoes yesterday which he said tasted bad.  No blood reported in the vomitus.   Past Medical History:  Diagnosis Date  . Arthritis   . Frequent headaches   . GERD (gastroesophageal reflux disease)   . History of stomach ulcers   . HTN (hypertension)   . Hypercholesteremia   . Iron deficiency anemia   . RBBB   . Right carotid bruit   . Urine incontinence     Patient Active Problem List   Diagnosis Date Noted  . Visit for TB skin test 03/31/2016  . Fungal nail infection 03/31/2016  . BPH (benign prostatic hyperplasia) 07/29/2015  . Encounter to establish care 07/29/2015  . Dementia 10/08/2014  . Hyperlipidemia 11/20/2013  . Chest pain 07/12/2013  . Preoperative clearance 07/12/2013  . Right carotid bruit 07/12/2013  . Renal insufficiency 07/12/2013  . Iron deficiency anemia 07/12/2013  . Right bundle branch block 07/12/2013  . Essential hypertension 07/12/2013  . GERD (gastroesophageal reflux disease) 07/12/2013    Past Surgical History:  Procedure Laterality Date  . BACK SURGERY  1996  . HEMORROIDECTOMY    . INGUINAL HERNIA REPAIR    . LUMBAR SPINE SURGERY     x2    Prior to Admission medications   Medication Sig Start Date  End Date Taking? Authorizing Provider  acetaminophen (TYLENOL) 325 MG tablet Take 650 mg by mouth 2 (two) times daily.   Yes [provider]  amLODipine (NORVASC) 10 MG tablet Take 1 tablet (10 mg total) by mouth daily. 03/26/16  Yes Doss, Oleh Genin, RN  Cholecalciferol (VITAMIN D-3) 5000 units TABS Take 1 tablet by mouth daily.   Yes [provider]  diltiazem (CARDIZEM CD) 120 MG 24 hr capsule Take 1 capsule (120 mg total) by mouth daily. 03/26/16  Yes Doss, Oleh Genin, RN  docusate sodium (COLACE) 100 MG capsule Take 100 mg by mouth daily as needed for mild constipation.   Yes [provider]  donepezil (ARICEPT) 10 MG tablet Take 10 mg by mouth at bedtime.   Yes [provider]  fluticasone (FLONASE) 50 MCG/ACT nasal spray Place 1 spray into both nostrils daily.   Yes [provider]  gabapentin (NEURONTIN) 300 MG capsule Take 300 mg by mouth at bedtime.   Yes [provider]  lisinopril (PRINIVIL,ZESTRIL) 5 MG tablet Take 5 mg by mouth daily.   Yes [provider]  Multiple Vitamins-Minerals (ICAPS) TABS Take 1 tablet by mouth 2 (two) times daily with a meal.   Yes [provider]  polyethylene glycol (MIRALAX / GLYCOLAX) packet Take 17 g by mouth daily.   Yes [provider]  pregabalin (LYRICA) 75 MG capsule Take 75 mg by mouth at bedtime.   Yes  [provider]    Allergies Patient has no known allergies.  Family History  Problem Relation Age of Onset  . Heart attack Mother   . Other Father        complication of surgery  . Stroke Unknown   . Diabetes Unknown   . Aneurysm Unknown   . Breast cancer Unknown   . Hyperlipidemia Unknown     Social History Social History   Tobacco Use  . Smoking status: Former Smoker    Packs/day: 0.50    Years: 5.00    Pack years: 2.50    Last attempt to quit: 12/21/1961    Years since quitting: 56.1  . Smokeless tobacco: Never Used  Substance Use Topics  .  Alcohol use: No    Alcohol/week: 0.0 oz  . Drug use: No    Review of Systems  Constitutional: No fever/chills Eyes: No visual changes. ENT: No sore throat. Cardiovascular: Denies chest pain. Respiratory: Denies shortness of breath. Gastrointestinal: No abdominal pain.  No diarrhea.  No constipation. Genitourinary: Negative for dysuria. Musculoskeletal: Negative for back pain. Skin: Negative for rash. Neurological: Negative for headaches, focal weakness or numbness.   ____________________________________________   PHYSICAL EXAM:  VITAL SIGNS: ED Triage Vitals  Enc Vitals Group     BP 02/16/18 1153 (!) 148/84     Pulse Rate 02/16/18 1153 (!) 106     Resp 02/16/18 1153 18     Temp 02/16/18 1153 98.9 F (37.2 C)     Temp Source 02/16/18 1153 Oral     SpO2 02/16/18 1150 97 %     Weight 02/16/18 1153 145 lb (65.8 kg)     Height 02/16/18 1153 5\' 2"  (1.575 m)     Head Circumference --      Peak Flow --      Pain Score --      Pain Loc --      Pain Edu? --      Excl. in GC? --     Constitutional: Alert and oriented. Well appearing and in no acute distress. Eyes: Conjunctivae are normal.  Head: Atraumatic. Nose: No congestion/rhinnorhea. Mouth/Throat: Mucous membranes are moist.  Neck: No stridor.   Cardiovascular: Tachycardic, regular rhythm. Grossly normal heart sounds.   Respiratory: Normal respiratory effort.  No retractions. Lungs CTAB. Gastrointestinal: Soft and nontender. No distention.  Musculoskeletal: No lower extremity tenderness nor edema.  No joint effusions. Neurologic:  Normal speech and language. No gross focal neurologic deficits are appreciated. Skin:  Skin is warm, dry and intact. No rash noted. Psychiatric: Mood and affect are normal. Speech and behavior are normal.  ____________________________________________   LABS (all labs ordered are listed, but only abnormal results are displayed)  Labs Reviewed  CBC WITH DIFFERENTIAL/PLATELET -  Abnormal; Notable for the following components:      Result Value   WBC 10.7 (*)    RBC 5.94 (*)    RDW 15.3 (*)    Neutro Abs 10.0 (*)    Lymphs Abs 0.4 (*)    All other components within normal limits  COMPREHENSIVE METABOLIC PANEL - Abnormal; Notable for the following components:   Glucose, Bld 110 (*)    BUN 25 (*)    Creatinine, Ser 1.55 (*)    Total Protein 8.6 (*)    GFR calc non Af Amer 38 (*)    GFR calc Af Amer 44 (*)    All other components within normal limits  TROPONIN I - Abnormal; Notable  for the following components:   Troponin I 0.03 (*)    All other components within normal limits  LIPASE, BLOOD  URINALYSIS, COMPLETE (UACMP) WITH MICROSCOPIC  TROPONIN I   ____________________________________________  EKG  ED ECG REPORT I, Arelia Longestavid M Anina Schnake, the attending physician, personally viewed and interpreted this ECG.   Date: 02/16/2018  EKG Time: 1152  Rate: 110  Rhythm: sinus tachycardia  Axis: Normal  Intervals:right bundle branch block  ST&T Change: T wave inversions in V2 through 4.  Also with minimal depression in V2 versus wandering baseline. T wave inversions appear to be increased from previous of July 12, 2013.  No more recent EKG for comparison. ____________________________________________  RADIOLOGY  KUB without acute pathology. ____________________________________________   PROCEDURES  Procedure(s) performed:   Procedures  Critical Care performed:   ____________________________________________   INITIAL IMPRESSION / ASSESSMENT AND PLAN / ED COURSE  Pertinent labs & imaging results that were available during my care of the patient were reviewed by me and considered in my medical decision making (see chart for details).  Differential diagnosis includes, but is not limited to, acute appendicitis, renal colic, testicular torsion, urinary tract infection/pyelonephritis, prostatitis,  epididymitis, diverticulitis, small bowel obstruction or  ileus, colitis, abdominal aortic aneurysm, gastroenteritis, hernia, etc. As part of my medical decision making, I reviewed the following data within the electronic MEDICAL RECORD NUMBER Notes from prior ED visits   ----------------------------------------- 3:06 PM on 02/16/2018 -----------------------------------------  Patient able to tolerate p.o. fluids as well as crackers.  Elevated troponin with new deep and T wave inversions in the precordial leads.  Patient says that he is feeling fine without nausea or any pain at this time.  Pending repeat troponin.  Signed out to Dr. Roxan Hockeyobinson.  ____________________________________________   FINAL CLINICAL IMPRESSION(S) / ED DIAGNOSES  Nausea and vomiting.    NEW MEDICATIONS STARTED DURING THIS VISIT:  New Prescriptions   No medications on file     Note:  This document was prepared using Dragon voice recognition software and may include unintentional dictation errors.     Myrna BlazerSchaevitz, Deborra Phegley Matthew, MD 02/16/18 [id number]

## 2018-02-16 NOTE — ED Notes (Signed)
Pt resting in bed with NAD noted, eyes closed, respirations even and unlabored, skin warm, dry, and intact. Will continue to monitor for further patient needs.

## 2018-02-16 NOTE — ED Triage Notes (Signed)
Pt presents to ED via POV from [facility] with c/o Nausea and vomiting x 1 day. Per EMS pt has hx of HTN and Alz. Pt is alert and oriented. Pt is noted to be HOH on assessment, no active vomiting noted during triage.

## 2018-02-16 NOTE — ED Notes (Signed)
Pt PO challenged by Corrie DandyMary, RN. Per Corrie DandyMary, no nausea or vomiting, pt tolerated well.

## 2018-02-16 NOTE — ED Notes (Signed)
Date and time results received: 02/16/18 1:01 PM   Test: Trop  Critical Value: 0.03  Name of Provider Notified: Dr. Pershing ProudSchaevitz  Orders Received? Or Actions Taken?: Critical Results Acknowledged

## 2018-03-08 ENCOUNTER — Emergency Department
Admission: EM | Admit: 2018-03-08 | Discharge: 2018-03-09 | Disposition: A | Discharge status: Skilled Nursing Facility | Payer: Medicare Other | Attending: Emergency Medicine | Admitting: Emergency Medicine

## 2018-03-08 ENCOUNTER — Other Ambulatory Visit: Payer: Self-pay

## 2018-03-08 DIAGNOSIS — J111 Influenza due to unidentified influenza virus with other respiratory manifestations: Secondary | ICD-10-CM | POA: Diagnosis not present

## 2018-03-08 DIAGNOSIS — Z87891 Personal history of nicotine dependence: Secondary | ICD-10-CM | POA: Insufficient documentation

## 2018-03-08 DIAGNOSIS — Z79899 Other long term (current) drug therapy: Secondary | ICD-10-CM | POA: Diagnosis not present

## 2018-03-08 DIAGNOSIS — R531 Weakness: Secondary | ICD-10-CM | POA: Diagnosis present

## 2018-03-08 DIAGNOSIS — F0390 Unspecified dementia without behavioral disturbance: Secondary | ICD-10-CM | POA: Insufficient documentation

## 2018-03-08 DIAGNOSIS — I10 Essential (primary) hypertension: Secondary | ICD-10-CM | POA: Diagnosis not present

## 2018-03-08 LAB — CBC WITH DIFFERENTIAL/PLATELET
BASOS PCT: 0 %
Basophils Absolute: 0 10*3/uL (ref 0–0.1)
Eosinophils Absolute: 0 10*3/uL (ref 0–0.7)
Eosinophils Relative: 1 %
HEMATOCRIT: 40.6 % (ref 40.0–52.0)
HEMOGLOBIN: 13.4 g/dL (ref 13.0–18.0)
LYMPHS ABS: 0.3 10*3/uL — AB (ref 1.0–3.6)
LYMPHS PCT: 7 %
MCH: 26.4 pg (ref 26.0–34.0)
MCHC: 33 g/dL (ref 32.0–36.0)
MCV: 79.9 fL — AB (ref 80.0–100.0)
MONO ABS: 0.5 10*3/uL (ref 0.2–1.0)
MONOS PCT: 11 %
NEUTROS ABS: 3.7 10*3/uL (ref 1.4–6.5)
NEUTROS PCT: 81 %
Platelets: 229 10*3/uL (ref 150–440)
RBC: 5.08 MIL/uL (ref 4.40–5.90)
RDW: 14.8 % — ABNORMAL HIGH (ref 11.5–14.5)
WBC: 4.6 10*3/uL (ref 3.8–10.6)

## 2018-03-08 LAB — URINALYSIS, COMPLETE (UACMP) WITH MICROSCOPIC
BACTERIA UA: NONE SEEN
Bilirubin Urine: NEGATIVE
GLUCOSE, UA: NEGATIVE mg/dL
KETONES UR: NEGATIVE mg/dL
Leukocytes, UA: NEGATIVE
NITRITE: NEGATIVE
PROTEIN: 100 mg/dL — AB
Specific Gravity, Urine: 1.021 (ref 1.005–1.030)
Squamous Epithelial / LPF: NONE SEEN
pH: 5 (ref 5.0–8.0)

## 2018-03-08 LAB — BASIC METABOLIC PANEL
ANION GAP: 10 (ref 5–15)
BUN: 20 mg/dL (ref 6–20)
CHLORIDE: 105 mmol/L (ref 101–111)
CO2: 22 mmol/L (ref 22–32)
Calcium: 9.2 mg/dL (ref 8.9–10.3)
Creatinine, Ser: 1.5 mg/dL — ABNORMAL HIGH (ref 0.61–1.24)
GFR calc non Af Amer: 40 mL/min — ABNORMAL LOW (ref >60)
GFR, EST AFRICAN AMERICAN: 46 mL/min — AB (ref >60)
GLUCOSE: 99 mg/dL (ref 65–99)
Potassium: 4.1 mmol/L (ref 3.5–5.1)
Sodium: 137 mmol/L (ref 135–145)

## 2018-03-08 LAB — LACTIC ACID, PLASMA: Lactic Acid, Venous: 0.9 mmol/L (ref 0.5–1.9)

## 2018-03-08 LAB — INFLUENZA PANEL BY PCR (TYPE A & B)
INFLAPCR: POSITIVE — AB
Influenza B By PCR: NEGATIVE

## 2018-03-08 MED ORDER — OSELTAMIVIR PHOSPHATE 75 MG PO CAPS: 75.0000 mg | ORAL_CAPSULE | Freq: Two times a day (BID) | ORAL | 0 refills | Status: AC

## 2018-03-08 MED ORDER — OSELTAMIVIR PHOSPHATE 75 MG PO CAPS
75.0000 mg | ORAL_CAPSULE | Freq: Once | ORAL | Status: AC
  Administered 2018-03-09: 75 mg via ORAL
  Filled 2018-03-08: qty 1

## 2018-03-08 NOTE — Discharge Instructions (Signed)
Please seek medical attention for any high fevers, chest pain, shortness of breath, change in behavior, persistent vomiting, bloody stool or any other new or concerning symptoms.  

## 2018-03-08 NOTE — ED Triage Notes (Signed)
Pt arrived via EMS from [facility] d/t weakness and fever. Pt denies any pain. Pt is A&O x4 at this time.

## 2018-03-08 NOTE — ED Provider Notes (Signed)
[facility] Emergency Department Provider Note _______________________________________   I have reviewed the triage vital signs and the nursing notes.   HISTORY  Chief Complaint Weakness   History limited by and level 5 caveat due to: Dementia   HPI Jake Griffin is a 84 y.o. male who presents to the emergency department today via EMS from living facility because of concern for weakness and fever. The patient himself states he was sent her to be "checked out" but does not describe any recent illness. Denies any nausea or vomiting. Denies any chest pain or shortness of breath. Of note other residents of the living facility have recently been evaluated and diagnosed with influenza.     Per medical record review patient has a history of dementia.   Past Medical History:  Diagnosis Date  . Arthritis   . Frequent headaches   . GERD (gastroesophageal reflux disease)   . History of stomach ulcers   . HTN (hypertension)   . Hypercholesteremia   . Iron deficiency anemia   . RBBB   . Right carotid bruit   . Urine incontinence     Patient Active Problem List   Diagnosis Date Noted  . Visit for TB skin test 03/31/2016  . Fungal nail infection 03/31/2016  . BPH (benign prostatic hyperplasia) 07/29/2015  . Encounter to establish care 07/29/2015  . Dementia 10/08/2014  . Hyperlipidemia 11/20/2013  . Chest pain 07/12/2013  . Preoperative clearance 07/12/2013  . Right carotid bruit 07/12/2013  . Renal insufficiency 07/12/2013  . Iron deficiency anemia 07/12/2013  . Right bundle branch block 07/12/2013  . Essential hypertension 07/12/2013  . GERD (gastroesophageal reflux disease) 07/12/2013    Past Surgical History:  Procedure Laterality Date  . BACK SURGERY  1996  . HEMORROIDECTOMY    . INGUINAL HERNIA REPAIR    . LUMBAR SPINE SURGERY     x2    Prior to Admission medications   Medication Sig Start Date End Date Taking? Authorizing Provider   acetaminophen (TYLENOL) 325 MG tablet Take 650 mg by mouth 2 (two) times daily.    [provider]  amLODipine (NORVASC) 10 MG tablet Take 1 tablet (10 mg total) by mouth daily. 03/26/16   Carollee Leitz, RN  Cholecalciferol (VITAMIN D-3) 5000 units TABS Take 1 tablet by mouth daily.    [provider]  diltiazem (CARDIZEM CD) 120 MG 24 hr capsule Take 1 capsule (120 mg total) by mouth daily. 03/26/16   Carollee Leitz, RN  docusate sodium (COLACE) 100 MG capsule Take 100 mg by mouth daily as needed for mild constipation.    [provider]  donepezil (ARICEPT) 10 MG tablet Take 10 mg by mouth at bedtime.    [provider]  fluticasone (FLONASE) 50 MCG/ACT nasal spray Place 1 spray into both nostrils daily.    [provider]  gabapentin (NEURONTIN) 300 MG capsule Take 300 mg by mouth at bedtime.    [provider]  lisinopril (PRINIVIL,ZESTRIL) 5 MG tablet Take 5 mg by mouth daily.    [provider]  Multiple Vitamins-Minerals (ICAPS) TABS Take 1 tablet by mouth 2 (two) times daily with a meal.    [provider]  polyethylene glycol (MIRALAX / GLYCOLAX) packet Take 17 g by mouth daily.    [provider]  pregabalin (LYRICA) 75 MG capsule Take 75 mg by mouth at bedtime.    [provider]    Allergies Patient has no  known allergies.  Family History  Problem Relation Age of Onset  . Heart attack Mother   . Other Father        complication of surgery  . Stroke Unknown   . Diabetes Unknown   . Aneurysm Unknown   . Breast cancer Unknown   . Hyperlipidemia Unknown     Social History Social History   Tobacco Use  . Smoking status: Former Smoker    Packs/day: 0.50    Years: 5.00    Pack years: 2.50    Last attempt to quit: 12/21/1961    Years since quitting: 56.2  . Smokeless tobacco: Never Used  Substance Use Topics  . Alcohol use: No    Alcohol/week: 0.0 oz  . Drug use: No    Review of  Systems Constitutional: No fever/chills Eyes: No visual changes. ENT: No sore throat. Cardiovascular: Denies chest pain. Respiratory: Denies shortness of breath. Gastrointestinal: No abdominal pain.  No nausea, no vomiting.  No diarrhea.   Genitourinary: Negative for dysuria. Musculoskeletal: Negative for back pain. Skin: Negative for rash. Neurological: Negative for headaches, focal weakness or numbness.  ____________________________________________   PHYSICAL EXAM:  VITAL SIGNS: ED Triage Vitals  Enc Vitals Group     BP 03/08/18 2151 (!) 158/98     Pulse Rate 03/08/18 2151 (!) 101     Resp 03/08/18 2151 19     Temp 03/08/18 2151 99.5 F (37.5 C)     Temp Source 03/08/18 2151 Oral     SpO2 03/08/18 2151 95 %     Weight 03/08/18 2152 145 lb (65.8 kg)     Height 03/08/18 2152 5\' 2"  (1.575 m)   Constitutional: Awake and alert. Not completely oriented to events.  Eyes: Conjunctivae are normal.  ENT   Head: Normocephalic and atraumatic.   Nose: No congestion/rhinnorhea.   Mouth/Throat: Mucous membranes are moist.   Neck: No stridor. Hematological/Lymphatic/Immunilogical: No cervical lymphadenopathy. Cardiovascular: Tachycardic, regular rhythm.  No murmurs, rubs, or gallops.  Respiratory: Normal respiratory effort without tachypnea nor retractions. Breath sounds are clear and equal bilaterally. No wheezes/rales/rhonchi. Gastrointestinal: Soft and non tender. No rebound. No guarding.  Genitourinary: Deferred Musculoskeletal: Normal range of motion in all extremities. No lower extremity edema. Neurologic:  Demented. Normal speech and language. No gross focal neurologic deficits are appreciated.  Skin:  Skin is warm, dry and intact. No rash noted. Psychiatric: Mood and affect are normal. Speech and behavior are normal. Patient exhibits appropriate insight and judgment.  ____________________________________________    LABS (pertinent  positives/negatives)  Influenza a positive BMP cr 1.50, k 4.1, na 137 Lactic acid 0.9 UA wnl CBC wbc 4.6, hgb 13.4, plt 229 ____________________________________________   EKG  I, Phineas Semen, attending physician, personally viewed and interpreted this EKG  EKG Time: 2152 Rate: 100 Rhythm: sinus tachycardia Axis: left axis deviation Intervals: qtc 474 QRS: IVCD ST changes: no st elevation Impression: abnormal ekg  ____________________________________________    RADIOLOGY  None  ____________________________________________   PROCEDURES  Procedures  ____________________________________________   INITIAL IMPRESSION / ASSESSMENT AND PLAN / ED COURSE  Pertinent labs & imaging results that were available during my care of the patient were reviewed by me and considered in my medical decision making (see chart for details).  Patient presented to the emergency department because of concerns for weakness and fever.  Patient is have flu positive testing today.  However was blood work without any overly concerning findings.  Mild elevation of creatinine which is not usual  for the patient.  Will start Tamiflu will plan on discharging with further Tamiflu.  ________________________________________   FINAL CLINICAL IMPRESSION(S) / ED DIAGNOSES  Final diagnoses:  Weakness  Influenza     Note: This dictation was prepared with Dragon dictation. Any transcriptional errors that result from this process are unintentional     Phineas SemenGoodman, Flor Houdeshell, MD 03/08/18 [id number]

## 2018-03-09 DIAGNOSIS — R531 Weakness: Secondary | ICD-10-CM | POA: Diagnosis not present

## 2018-03-09 LAB — TROPONIN I: TROPONIN I: 0.04 ng/mL — AB (ref <0.03)

## 2018-03-09 NOTE — ED Notes (Signed)
Unable to capture DC signature at this time. Pt understands that he is going home at this time. Pt verbalizes no further questions to this RN.

## 2019-07-03 ENCOUNTER — Other Ambulatory Visit (HOSPITAL_COMMUNITY): Payer: Self-pay | Admitting: Physician Assistant

## 2019-07-03 ENCOUNTER — Other Ambulatory Visit: Payer: Self-pay

## 2019-07-03 ENCOUNTER — Ambulatory Visit (HOSPITAL_COMMUNITY)
Admission: RE | Admit: 2019-07-03 | Discharge: 2019-07-03 | Disposition: A | Discharge status: Home / Self Care | Payer: Medicare Other | Source: Ambulatory Visit | Attending: Physician Assistant | Admitting: Physician Assistant

## 2019-07-03 DIAGNOSIS — M79672 Pain in left foot: Secondary | ICD-10-CM | POA: Insufficient documentation

## 2019-07-04 ENCOUNTER — Ambulatory Visit (INDEPENDENT_AMBULATORY_CARE_PROVIDER_SITE_OTHER): Payer: Medicare Other | Admitting: Orthopaedic Surgery

## 2019-07-04 ENCOUNTER — Encounter: Payer: Self-pay | Admitting: Orthopaedic Surgery

## 2019-07-04 VITALS — BP 170/71 | HR 84 | Temp 98.6°F | Ht 62.0 in | Wt 170.0 lb

## 2019-07-04 DIAGNOSIS — S92352A Displaced fracture of fifth metatarsal bone, left foot, initial encounter for closed fracture: Secondary | ICD-10-CM

## 2019-07-04 NOTE — Progress Notes (Signed)
Subjective:    Patient ID: Jake Griffin, male    DOB: 1926-12-26, 85 y.o.   MRN: [id number]  HPI He is a resident at a local rest home.  He hurt his left  foot sometime last week or even a few days before that.  He did not tell anyone.  The facility doctor noted it on his rounds.  X-rays were done and show a fracture of the fifth metatarsal shaft.  He also has bruising of the second toe.  An appointment was requested this morning and I asked that he come in now.  He is very, very hard of hearing.  His overall health is good.    Review of Systems  Constitutional: Positive for activity change.  Musculoskeletal: Positive for gait problem and joint swelling.  All other systems reviewed and are negative.  For Review of Systems, all other systems reviewed and are negative.  The following is a summary of the past history medically, past history surgically, known current medicines, social history and family history.  This information is gathered electronically by the computer from prior information and documentation.  I review this each visit and have found including this information at this point in the chart is beneficial and informative.   Past Medical History:  Diagnosis Date  . Arthritis   . Frequent headaches   . GERD (gastroesophageal reflux disease)   . History of stomach ulcers   . HTN (hypertension)   . Hypercholesteremia   . Iron deficiency anemia   . RBBB   . Right carotid bruit   . Urine incontinence     Past Surgical History:  Procedure Laterality Date  . BACK SURGERY  1996  . HEMORROIDECTOMY    . INGUINAL HERNIA REPAIR    . LUMBAR SPINE SURGERY     x2    Current Outpatient Medications on File Prior to Visit  Medication Sig Dispense Refill  . acetaminophen (TYLENOL) 325 MG tablet Take 650 mg by mouth 2 (two) times daily.    Marland Kitchen. amLODipine (NORVASC) 10 MG tablet Take 1 tablet (10 mg total) by mouth daily. 30 tablet 11  . Cholecalciferol (VITAMIN D-3) 5000 units TABS  Take 1 tablet by mouth daily.    Marland Kitchen. diltiazem (CARDIZEM CD) 120 MG 24 hr capsule Take 1 capsule (120 mg total) by mouth daily. 30 capsule 11  . docusate sodium (COLACE) 100 MG capsule Take 100 mg by mouth daily as needed for mild constipation.    Marland Kitchen. donepezil (ARICEPT) 10 MG tablet Take 10 mg by mouth at bedtime.    . fluticasone (FLONASE) 50 MCG/ACT nasal spray Place 1 spray into both nostrils daily.    Marland Kitchen. gabapentin (NEURONTIN) 300 MG capsule Take 300 mg by mouth at bedtime.    Marland Kitchen. lisinopril (PRINIVIL,ZESTRIL) 5 MG tablet Take 5 mg by mouth daily.    . Multiple Vitamins-Minerals (ICAPS) TABS Take 1 tablet by mouth 2 (two) times daily with a meal.    . polyethylene glycol (MIRALAX / GLYCOLAX) packet Take 17 g by mouth daily.    . pregabalin (LYRICA) 75 MG capsule Take 75 mg by mouth at bedtime.     No current facility-administered medications on file prior to visit.     Social History   Socioeconomic History  . Marital status: Widowed    Spouse name: Bard HerbertDaphne  . Number of children: 1  . Years of education: HS  . Highest education level: Not on file  Occupational History  .  Occupation: retired  Engineer, productionocial Needs  . Financial resource strain: Not on file  . Food insecurity    Worry: Not on file    Inability: Not on file  . Transportation needs    Medical: Not on file    Non-medical: Not on file  Tobacco Use  . Smoking status: Former Smoker    Packs/day: 0.50    Years: 5.00    Pack years: 2.50    Quit date: 12/21/1961    Years since quitting: 57.5  . Smokeless tobacco: Never Used  Substance and Sexual Activity  . Alcohol use: No    Alcohol/week: 0.0 standard drinks  . Drug use: No  . Sexual activity: Not on file  Lifestyle  . Physical activity    Days per week: Not on file    Minutes per session: Not on file  . Stress: Not on file  Relationships  . Social Musicianconnections    Talks on phone: Not on file    Gets together: Not on file    Attends religious service: Not on file     Active member of club or organization: Not on file    Attends meetings of clubs or organizations: Not on file    Relationship status: Not on file  . Intimate partner violence    Fear of current or ex partner: Not on file    Emotionally abused: Not on file    Physically abused: Not on file    Forced sexual activity: Not on file  Other Topics Concern  . Not on file  Social History Narrative   Patient lives at home with spouse.   Caffeine use: 1 cup daily    Family History  Problem Relation Age of Onset  . Heart attack Mother   . Other Father        complication of surgery  . Stroke Unknown   . Diabetes Unknown   . Aneurysm Unknown   . Breast cancer Unknown   . Hyperlipidemia Unknown     BP (!) 170/71   Pulse 84   Temp 98.6 F (37 C)   Ht 5\' 2"  (1.575 m)   Wt 170 lb (77.1 kg)   BMI 31.09 kg/m   Body mass index is 31.09 kg/m.     Objective:   Physical Exam Vitals signs reviewed.  Constitutional:      Appearance: He is well-developed.  HENT:     Head: Normocephalic and atraumatic.  Eyes:     Conjunctiva/sclera: Conjunctivae normal.     Pupils: Pupils are equal, round, and reactive to light.  Neck:     Musculoskeletal: Normal range of motion and neck supple.  Cardiovascular:     Rate and Rhythm: Normal rate and regular rhythm.  Pulmonary:     Effort: Pulmonary effort is normal.  Abdominal:     Palpations: Abdomen is soft.  Musculoskeletal:       Feet:  Skin:    General: Skin is warm and dry.  Neurological:     Mental Status: He is alert and oriented to person, place, and time.     Cranial Nerves: No cranial nerve deficit.     Motor: No abnormal muscle tone.     Coordination: Coordination normal.     Deep Tendon Reflexes: Reflexes are normal and symmetric. Reflexes normal.  Psychiatric:        Behavior: Behavior normal.        Thought Content: Thought content normal.  Judgment: Judgment normal.   I have reviewed his notes from the rest home and  the x-rays and report.        Assessment & Plan:   Encounter Diagnosis  Name Primary?  . Closed displaced fracture of fifth metatarsal bone of left foot, initial encounter Yes   He is given a CAM walker.  Forms completed for the rest home.  Return in two weeks.  X-rays on return of the left foot.  Call if any problem.  Precautions discussed.   Electronically Signed Sanjuana Kava, MD 7/14/202011:12 AM

## 2019-07-18 ENCOUNTER — Ambulatory Visit: Payer: Medicare Other

## 2019-07-18 ENCOUNTER — Encounter: Payer: Self-pay | Admitting: Orthopaedic Surgery

## 2019-07-18 ENCOUNTER — Other Ambulatory Visit: Payer: Self-pay

## 2019-07-18 ENCOUNTER — Ambulatory Visit (INDEPENDENT_AMBULATORY_CARE_PROVIDER_SITE_OTHER): Payer: Medicare Other | Admitting: Orthopaedic Surgery

## 2019-07-18 VITALS — Temp 98.1°F

## 2019-07-18 DIAGNOSIS — S92352D Displaced fracture of fifth metatarsal bone, left foot, subsequent encounter for fracture with routine healing: Secondary | ICD-10-CM

## 2019-07-18 NOTE — Progress Notes (Signed)
CC:  My foot is better  He has less pain of the left foot.  He is in the CAM walker.  X-rays of the left foot was done, reported separately.  Encounter Diagnosis  Name Primary?  . Closed displaced fracture of fifth metatarsal bone of left foot with routine healing, subsequent encounter Yes   Continue the CAM walker,  Return in two weeks. X-rays then.  Forms for rest home completed.  Call if any problem.  Precautions discussed.   Electronically Signed Sanjuana Kava, MD 7/28/20209:53 AM

## 2019-08-22 ENCOUNTER — Ambulatory Visit (INDEPENDENT_AMBULATORY_CARE_PROVIDER_SITE_OTHER): Payer: Medicare Other | Admitting: Orthopaedic Surgery

## 2019-08-22 ENCOUNTER — Other Ambulatory Visit: Payer: Self-pay | Admitting: Orthopaedic Surgery

## 2019-08-22 ENCOUNTER — Encounter: Payer: Self-pay | Admitting: Orthopaedic Surgery

## 2019-08-22 ENCOUNTER — Other Ambulatory Visit: Payer: Self-pay

## 2019-08-22 ENCOUNTER — Ambulatory Visit: Payer: Medicare Other

## 2019-08-22 DIAGNOSIS — S92352D Displaced fracture of fifth metatarsal bone, left foot, subsequent encounter for fracture with routine healing: Secondary | ICD-10-CM | POA: Diagnosis not present

## 2019-08-22 NOTE — Progress Notes (Signed)
CC:  My foot is fine  He has no pain of the left foot.  X-rays were done, reported separately.  Encounter Diagnosis  Name Primary?  . Closed displaced fracture of fifth metatarsal bone of left foot with routine healing, subsequent encounter Yes   Discharge.  Forms for rest home completed.  Electronically Signed Sanjuana Kava, MD 9/1/202010:18 AM

## 2020-01-04 DIAGNOSIS — N183 Chronic kidney disease, stage 3 unspecified: Secondary | ICD-10-CM | POA: Diagnosis not present

## 2020-01-04 DIAGNOSIS — Z6827 Body mass index (BMI) 27.0-27.9, adult: Secondary | ICD-10-CM | POA: Diagnosis not present

## 2020-01-04 DIAGNOSIS — K219 Gastro-esophageal reflux disease without esophagitis: Secondary | ICD-10-CM | POA: Diagnosis not present

## 2020-01-04 DIAGNOSIS — I10 Essential (primary) hypertension: Secondary | ICD-10-CM | POA: Diagnosis not present

## 2020-01-04 DIAGNOSIS — Z0001 Encounter for general adult medical examination with abnormal findings: Secondary | ICD-10-CM | POA: Diagnosis not present

## 2020-01-04 DIAGNOSIS — Z1389 Encounter for screening for other disorder: Secondary | ICD-10-CM | POA: Diagnosis not present

## 2020-05-08 DIAGNOSIS — Z6827 Body mass index (BMI) 27.0-27.9, adult: Secondary | ICD-10-CM | POA: Diagnosis not present

## 2020-05-08 DIAGNOSIS — E663 Overweight: Secondary | ICD-10-CM | POA: Diagnosis not present

## 2020-05-08 DIAGNOSIS — L989 Disorder of the skin and subcutaneous tissue, unspecified: Secondary | ICD-10-CM | POA: Diagnosis not present

## 2020-05-28 DIAGNOSIS — B078 Other viral warts: Secondary | ICD-10-CM | POA: Diagnosis not present

## 2020-05-28 DIAGNOSIS — D485 Neoplasm of uncertain behavior of skin: Secondary | ICD-10-CM | POA: Diagnosis not present

## 2020-07-06 ENCOUNTER — Encounter: Payer: Self-pay | Admitting: Emergency Medicine

## 2020-07-06 ENCOUNTER — Emergency Department
Admission: EM | Admit: 2020-07-06 | Discharge: 2020-07-06 | Disposition: A | Discharge status: Home / Self Care | Payer: Medicare PPO | Attending: Emergency Medicine | Admitting: Emergency Medicine

## 2020-07-06 ENCOUNTER — Other Ambulatory Visit: Payer: Self-pay

## 2020-07-06 DIAGNOSIS — Z87891 Personal history of nicotine dependence: Secondary | ICD-10-CM | POA: Diagnosis not present

## 2020-07-06 DIAGNOSIS — I10 Essential (primary) hypertension: Secondary | ICD-10-CM | POA: Diagnosis not present

## 2020-07-06 DIAGNOSIS — B029 Zoster without complications: Secondary | ICD-10-CM | POA: Insufficient documentation

## 2020-07-06 DIAGNOSIS — Z79899 Other long term (current) drug therapy: Secondary | ICD-10-CM | POA: Insufficient documentation

## 2020-07-06 DIAGNOSIS — R21 Rash and other nonspecific skin eruption: Secondary | ICD-10-CM | POA: Diagnosis present

## 2020-07-06 MED ORDER — ONDANSETRON 4 MG PO TBDP: 4.0000 mg | ORAL_TABLET | Freq: Three times a day (TID) | ORAL | 0 refills | Status: AC | PRN

## 2020-07-06 MED ORDER — HYDROCODONE-ACETAMINOPHEN 5-325 MG PO TABS: 1.0000 | ORAL_TABLET | Freq: Four times a day (QID) | ORAL | 0 refills | Status: DC | PRN

## 2020-07-06 MED ORDER — ONDANSETRON 4 MG PO TBDP: 4.0000 mg | ORAL_TABLET | Freq: Three times a day (TID) | ORAL | 0 refills | Status: DC | PRN

## 2020-07-06 MED ORDER — VALACYCLOVIR HCL 1 G PO TABS: 1000.0000 mg | ORAL_TABLET | Freq: Three times a day (TID) | ORAL | 0 refills | Status: AC

## 2020-07-06 MED ORDER — HYDROCODONE-ACETAMINOPHEN 5-325 MG PO TABS: 1.0000 | ORAL_TABLET | Freq: Four times a day (QID) | ORAL | 0 refills | Status: AC | PRN

## 2020-07-06 NOTE — Discharge Instructions (Signed)
You can take Valtrex 3 times daily for the next 7 days. Please take Norco with Zofran for pain.

## 2020-07-06 NOTE — ED Provider Notes (Signed)
Emergency Department Provider Note  ____________________________________________  Time seen: Approximately 5:21 PM  I have reviewed the triage vital signs and the nursing notes.   HISTORY  Chief Complaint Rash   Historian Patient     HPI Jake Griffin is a 86 y.o. male presents to the emergency department with an erythematous, vesicular rash along the left flank.  Patient states that rash is tender but not particularly painful.  Patient does describe a burning sensation around rash.  He has never had shingles in the past.  No other alleviating measures have been attempted.   Past Medical History:  Diagnosis Date  . Arthritis   . Frequent headaches   . GERD (gastroesophageal reflux disease)   . History of stomach ulcers   . HTN (hypertension)   . Hypercholesteremia   . Iron deficiency anemia   . RBBB   . Right carotid bruit   . Urine incontinence      Immunizations up to date:  Yes.     Past Medical History:  Diagnosis Date  . Arthritis   . Frequent headaches   . GERD (gastroesophageal reflux disease)   . History of stomach ulcers   . HTN (hypertension)   . Hypercholesteremia   . Iron deficiency anemia   . RBBB   . Right carotid bruit   . Urine incontinence     Patient Active Problem List   Diagnosis Date Noted  . Visit for TB skin test 03/31/2016  . Fungal nail infection 03/31/2016  . BPH (benign prostatic hyperplasia) 07/29/2015  . Encounter to establish care 07/29/2015  . Dementia (HCC) 10/08/2014  . Hyperlipidemia 11/20/2013  . Chest pain 07/12/2013  . Preoperative clearance 07/12/2013  . Right carotid bruit 07/12/2013  . Renal insufficiency 07/12/2013  . Iron deficiency anemia 07/12/2013  . Right bundle branch block 07/12/2013  . Essential hypertension 07/12/2013  . GERD (gastroesophageal reflux disease) 07/12/2013    Past Surgical History:  Procedure Laterality Date  . BACK SURGERY  1996  . HEMORROIDECTOMY    . INGUINAL HERNIA REPAIR     . LUMBAR SPINE SURGERY     x2    Prior to Admission medications   Medication Sig Start Date End Date Taking? Authorizing Provider  acetaminophen (TYLENOL) 325 MG tablet Take 650 mg by mouth 2 (two) times daily.    [provider]  amLODipine (NORVASC) 10 MG tablet Take 1 tablet (10 mg total) by mouth daily. 03/26/16   Carollee Leitz, RN  Cholecalciferol (VITAMIN D-3) 5000 units TABS Take 1 tablet by mouth daily.    [provider]  diltiazem (CARDIZEM CD) 120 MG 24 hr capsule Take 1 capsule (120 mg total) by mouth daily. 03/26/16   Carollee Leitz, RN  docusate sodium (COLACE) 100 MG capsule Take 100 mg by mouth daily as needed for mild constipation.    [provider]  donepezil (ARICEPT) 10 MG tablet Take 10 mg by mouth at bedtime.    [provider]  fluticasone (FLONASE) 50 MCG/ACT nasal spray Place 1 spray into both nostrils daily.    [provider]  gabapentin (NEURONTIN) 300 MG capsule Take 300 mg by mouth at bedtime.    [provider]  HYDROcodone-acetaminophen (NORCO) 5-325 MG tablet Take 1 tablet by mouth every 6 (six) hours as needed for up to 3 days. 07/06/20 07/09/20  Orvil Feil, PA-C  lisinopril (PRINIVIL,ZESTRIL) 5 MG tablet Take 5 mg by mouth daily.    [provider]  Multiple Vitamins-Minerals (ICAPS) TABS Take 1 tablet by mouth 2 (two) times daily with a meal.    [provider]  ondansetron (ZOFRAN ODT) 4 MG disintegrating tablet Take 1 tablet (4 mg total) by mouth every 8 (eight) hours as needed for up to 5 days. 07/06/20 07/11/20  Orvil Feil, PA-C  polyethylene glycol (MIRALAX / GLYCOLAX) packet Take 17 g by mouth daily.    [provider]  pregabalin (LYRICA) 50 MG capsule  07/24/19   [provider]  pregabalin (LYRICA) 75 MG capsule Take 75 mg by mouth at bedtime.    [provider]  valACYclovir (VALTREX) 1000 MG tablet Take 1 tablet (1,000 mg total) by mouth 3  (three) times daily for 7 days. 07/06/20 07/13/20  Orvil Feil, PA-C    Allergies Patient has no known allergies.  Family History  Problem Relation Age of Onset  . Heart attack Mother   . Other Father        complication of surgery  . Stroke Other   . Diabetes Other   . Aneurysm Other   . Breast cancer Other   . Hyperlipidemia Other     Social History Social History   Tobacco Use  . Smoking status: Former Smoker    Packs/day: 0.50    Years: 5.00    Pack years: 2.50    Quit date: 12/21/1961    Years since quitting: 58.5  . Smokeless tobacco: Never Used  Substance Use Topics  . Alcohol use: No    Alcohol/week: 0.0 standard drinks  . Drug use: No     Review of Systems  Constitutional: No fever/chills Eyes:  No discharge ENT: No upper respiratory complaints. Respiratory: no cough. No SOB/ use of accessory muscles to breath Gastrointestinal:   No nausea, no vomiting.  No diarrhea.  No constipation. Musculoskeletal: Negative for musculoskeletal pain. Skin: Patient has rash.     ____________________________________________   PHYSICAL EXAM:  VITAL SIGNS: ED Triage Vitals  Enc Vitals Group     BP 07/06/20 1619 (!) 145/77     Pulse Rate 07/06/20 1619 68     Resp 07/06/20 1619 18     Temp 07/06/20 1619 99.2 F (37.3 C)     Temp Source 07/06/20 1619 Oral     SpO2 07/06/20 1619 98 %     Weight 07/06/20 1622 170 lb (77.1 kg)     Height 07/06/20 1622 5\' 6"  (1.676 m)     Head Circumference --      Peak Flow --      Pain Score --      Pain Loc --      Pain Edu? --      Excl. in GC? --      Constitutional: Alert and oriented. Well appearing and in no acute distress. Eyes: Conjunctivae are normal. PERRL. EOMI. Head: Atraumatic. Cardiovascular: Normal rate, regular rhythm. Normal S1 and S2.  Good peripheral circulation. Respiratory: Normal respiratory effort without tachypnea or retractions. Lungs CTAB. Good air entry to the bases with no decreased or absent  breath sounds Gastrointestinal: Bowel sounds x 4 quadrants. Soft and nontender to palpation. No guarding or rigidity. No distention. Musculoskeletal: Full range of motion to all extremities. No obvious deformities noted Neurologic:  Normal for age. No gross focal neurologic deficits are appreciated.  Skin: Patient has an erythematous, vesicular rash on the left flank.  Psychiatric: Mood and affect are normal for age. Speech and behavior are normal.  ____________________________________________   LABS (all labs ordered are listed, but only abnormal results are displayed)  Labs Reviewed - No data to display ____________________________________________  EKG   ____________________________________________  RADIOLOGY   No results found.  ____________________________________________    PROCEDURES  Procedure(s) performed:     Procedures     Medications - No data to display   ____________________________________________   INITIAL IMPRESSION / ASSESSMENT AND PLAN / ED COURSE  Pertinent labs & imaging results that were available during my care of the patient were reviewed by me and considered in my medical decision making (see chart for details).      Assessment and plan Zoster 86 year old male presents to the emergency department with an erythematous, vesicular rash along left flank.  History and physical exam findings suggest shingles.  We will start patient on Valtrex.  Patient was also started on a short course of Norco for pain.  Return precautions were given to return with new or worsening symptoms.    ____________________________________________  FINAL CLINICAL IMPRESSION(S) / ED DIAGNOSES  Final diagnoses:  Herpes zoster without complication      NEW MEDICATIONS STARTED DURING THIS VISIT:  ED Discharge Orders         Ordered    valACYclovir (VALTREX) 1000 MG tablet  3 times daily     Discontinue  Reprint     07/06/20 1645     HYDROcodone-acetaminophen (NORCO) 5-325 MG tablet  Every 6 hours PRN,   Status:  Discontinued     Reprint     07/06/20 1645    ondansetron (ZOFRAN ODT) 4 MG disintegrating tablet  Every 8 hours PRN,   Status:  Discontinued     Reprint     07/06/20 1645    HYDROcodone-acetaminophen (NORCO) 5-325 MG tablet  Every 6 hours PRN     Discontinue  Reprint     07/06/20 1714    ondansetron (ZOFRAN ODT) 4 MG disintegrating tablet  Every 8 hours PRN     Discontinue  Reprint     07/06/20 1714              This chart was dictated using voice recognition software/Dragon. Despite best efforts to proofread, errors can occur which can change the meaning. Any change was purely unintentional.     Orvil Feil, PA-C 07/06/20 1726    Sharman Cheek, MD 07/07/20 0021

## 2020-07-06 NOTE — ED Triage Notes (Signed)
Pt comes in POV with daughter in law for rash on lower back. Unsure of when started. Looks similar to shingles per picture on daughter's phone. Has had chicken pox when little.

## 2021-01-13 IMAGING — DX LEFT FOOT - COMPLETE 3+ VIEW
3 series · 3 of 3 positions shown · non-contrast
Comparison: None.

CLINICAL DATA: Lateral foot pain post fall.

EXAM:
LEFT FOOT - COMPLETE 3+ VIEW

[foot ap]
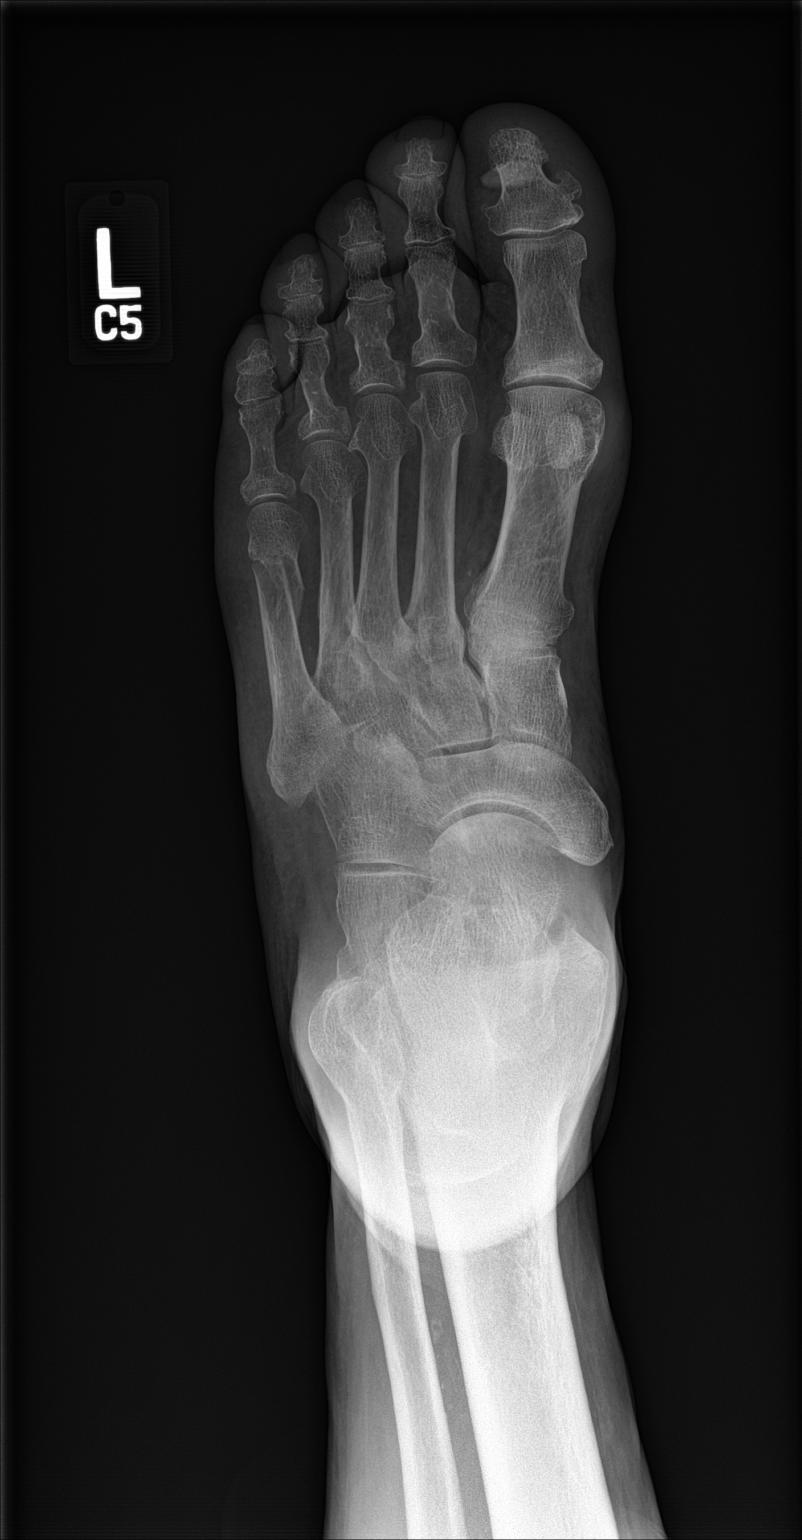

[foot obl]
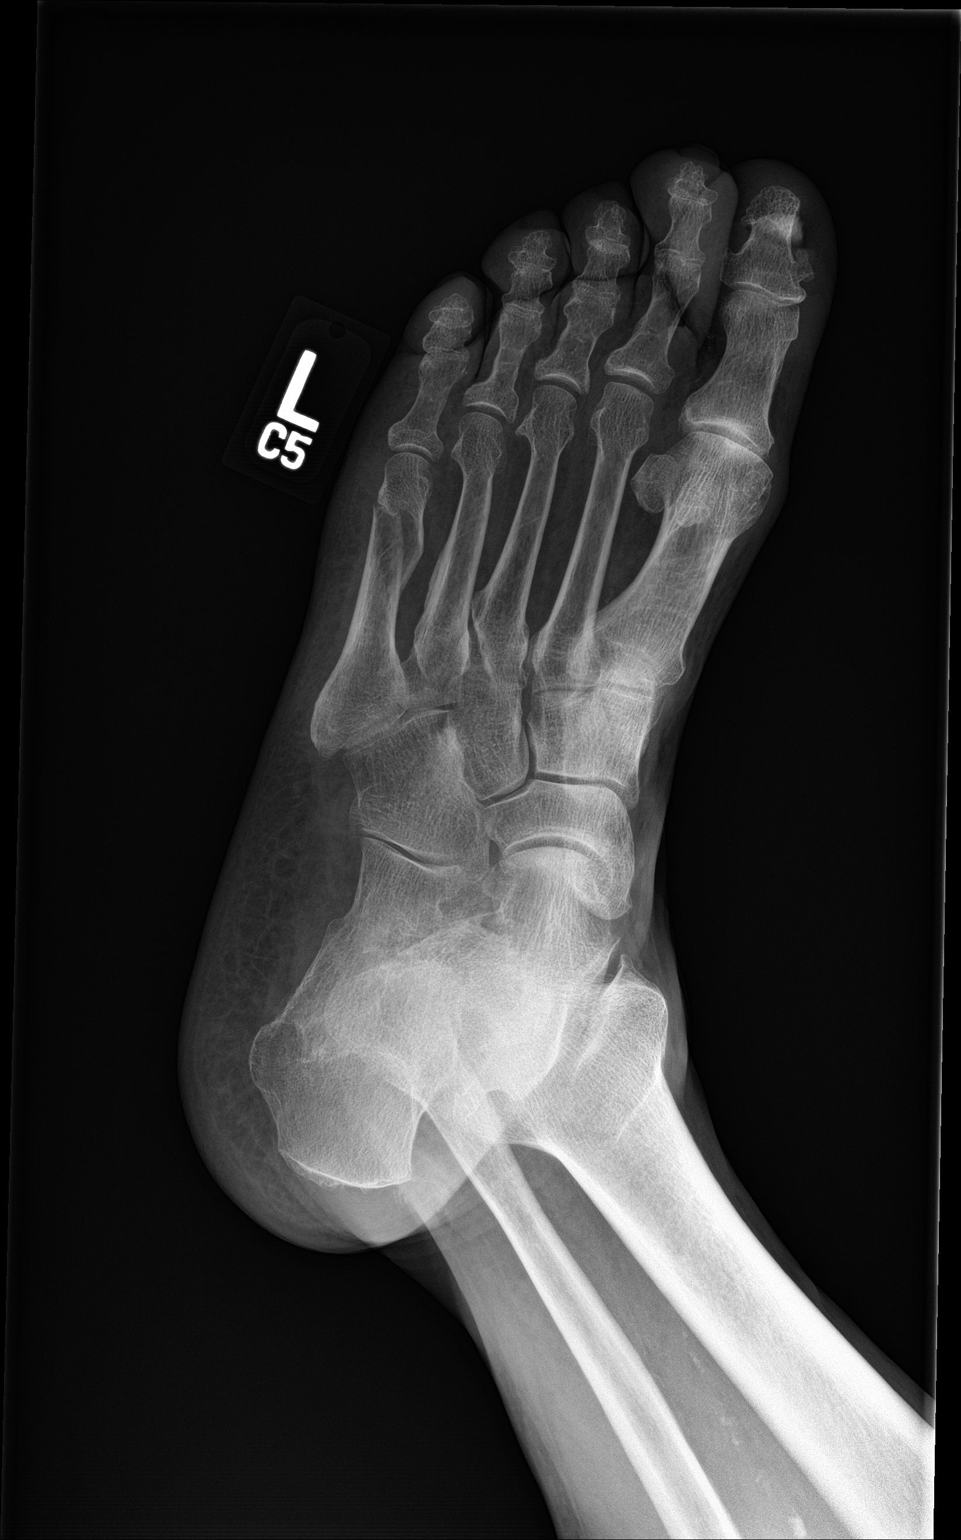

[foot lat]
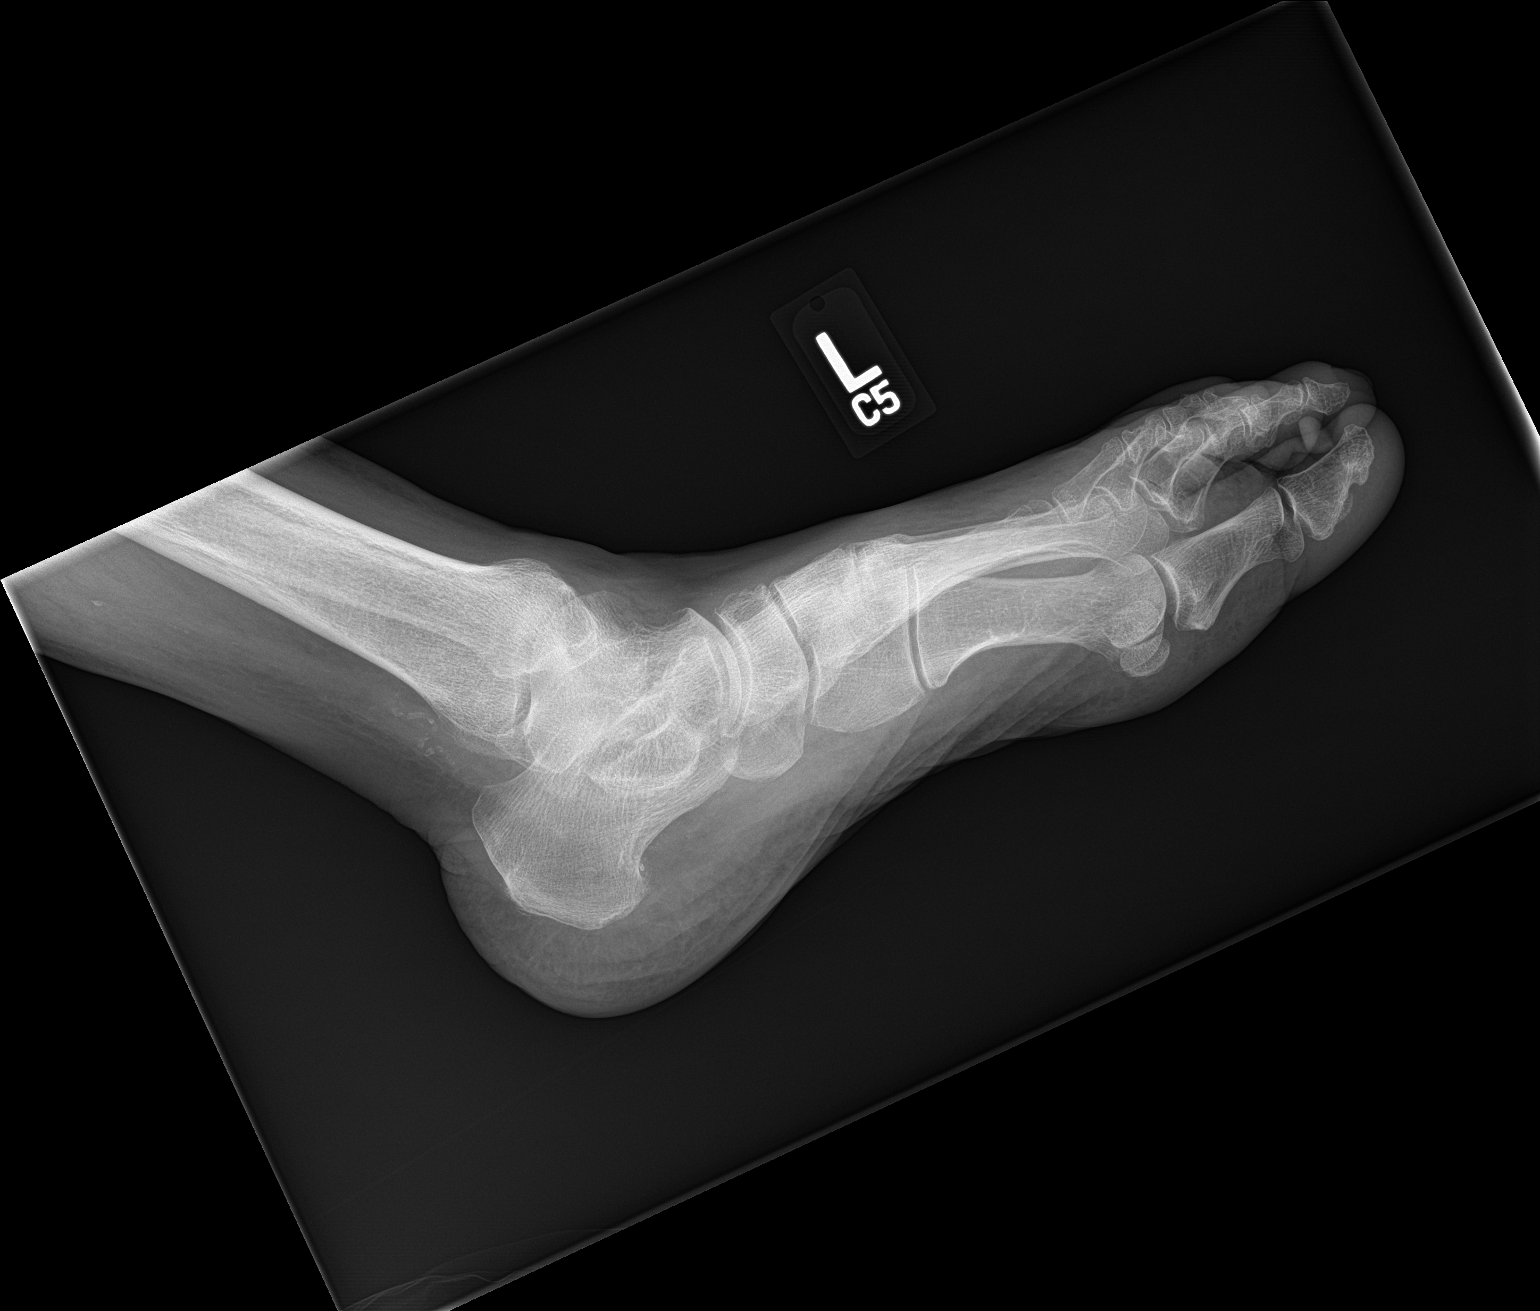

[3 of 3 positions shown; findings below may reference images not displayed]

FINDINGS: There is an oblique fracture through the distal third of the fifth
metatarsal bone with mild impaction. The fracture lines however have
somewhat corticated appearance. Lateral soft tissue swelling.
IMPRESSION: Age-indeterminate oblique fracture through the distal third of the
fifth metatarsal bone with mild impaction.

## 2021-01-23 DIAGNOSIS — Z6825 Body mass index (BMI) 25.0-25.9, adult: Secondary | ICD-10-CM | POA: Diagnosis not present

## 2021-01-23 DIAGNOSIS — Z1331 Encounter for screening for depression: Secondary | ICD-10-CM | POA: Diagnosis not present

## 2021-01-23 DIAGNOSIS — Z0001 Encounter for general adult medical examination with abnormal findings: Secondary | ICD-10-CM | POA: Diagnosis not present

## 2021-01-23 DIAGNOSIS — E7849 Other hyperlipidemia: Secondary | ICD-10-CM | POA: Diagnosis not present

## 2021-01-23 DIAGNOSIS — Z1389 Encounter for screening for other disorder: Secondary | ICD-10-CM | POA: Diagnosis not present

## 2021-01-23 DIAGNOSIS — Z0000 Encounter for general adult medical examination without abnormal findings: Secondary | ICD-10-CM | POA: Diagnosis not present

## 2021-06-26 DIAGNOSIS — N400 Enlarged prostate without lower urinary tract symptoms: Secondary | ICD-10-CM | POA: Diagnosis not present

## 2021-06-26 DIAGNOSIS — F0390 Unspecified dementia without behavioral disturbance: Secondary | ICD-10-CM | POA: Diagnosis not present

## 2021-06-26 DIAGNOSIS — E782 Mixed hyperlipidemia: Secondary | ICD-10-CM | POA: Diagnosis not present

## 2021-06-26 DIAGNOSIS — K219 Gastro-esophageal reflux disease without esophagitis: Secondary | ICD-10-CM | POA: Diagnosis not present

## 2021-06-26 DIAGNOSIS — M5136 Other intervertebral disc degeneration, lumbar region: Secondary | ICD-10-CM | POA: Diagnosis not present

## 2021-06-26 DIAGNOSIS — G629 Polyneuropathy, unspecified: Secondary | ICD-10-CM | POA: Diagnosis not present

## 2021-06-26 DIAGNOSIS — Z6824 Body mass index (BMI) 24.0-24.9, adult: Secondary | ICD-10-CM | POA: Diagnosis not present

## 2021-06-26 DIAGNOSIS — I10 Essential (primary) hypertension: Secondary | ICD-10-CM | POA: Diagnosis not present

## 2021-06-26 DIAGNOSIS — N183 Chronic kidney disease, stage 3 unspecified: Secondary | ICD-10-CM | POA: Diagnosis not present

## 2021-10-28 DIAGNOSIS — I10 Essential (primary) hypertension: Secondary | ICD-10-CM | POA: Diagnosis not present

## 2021-10-28 DIAGNOSIS — M150 Primary generalized (osteo)arthritis: Secondary | ICD-10-CM | POA: Diagnosis not present

## 2021-10-28 DIAGNOSIS — F0390 Unspecified dementia without behavioral disturbance: Secondary | ICD-10-CM | POA: Diagnosis not present

## 2021-11-27 DIAGNOSIS — Z6824 Body mass index (BMI) 24.0-24.9, adult: Secondary | ICD-10-CM | POA: Diagnosis not present

## 2021-11-27 DIAGNOSIS — F0390 Unspecified dementia without behavioral disturbance: Secondary | ICD-10-CM | POA: Diagnosis not present

## 2021-11-27 DIAGNOSIS — G479 Sleep disorder, unspecified: Secondary | ICD-10-CM | POA: Diagnosis not present

## 2021-11-27 DIAGNOSIS — M5136 Other intervertebral disc degeneration, lumbar region: Secondary | ICD-10-CM | POA: Diagnosis not present

## 2022-01-05 DIAGNOSIS — F0390 Unspecified dementia without behavioral disturbance: Secondary | ICD-10-CM | POA: Diagnosis not present

## 2022-01-05 DIAGNOSIS — Z634 Disappearance and death of family member: Secondary | ICD-10-CM | POA: Diagnosis not present

## 2022-01-05 DIAGNOSIS — Z593 Problems related to living in residential institution: Secondary | ICD-10-CM | POA: Diagnosis not present

## 2022-01-05 DIAGNOSIS — Z79899 Other long term (current) drug therapy: Secondary | ICD-10-CM | POA: Diagnosis not present

## 2022-03-20 DIAGNOSIS — Z0000 Encounter for general adult medical examination without abnormal findings: Secondary | ICD-10-CM | POA: Diagnosis not present

## 2022-07-27 DIAGNOSIS — F0390 Unspecified dementia without behavioral disturbance: Secondary | ICD-10-CM | POA: Diagnosis not present

## 2022-07-27 DIAGNOSIS — Z6823 Body mass index (BMI) 23.0-23.9, adult: Secondary | ICD-10-CM | POA: Diagnosis not present

## 2022-07-27 DIAGNOSIS — K219 Gastro-esophageal reflux disease without esophagitis: Secondary | ICD-10-CM | POA: Diagnosis not present

## 2022-07-27 DIAGNOSIS — G629 Polyneuropathy, unspecified: Secondary | ICD-10-CM | POA: Diagnosis not present

## 2022-07-27 DIAGNOSIS — Z1331 Encounter for screening for depression: Secondary | ICD-10-CM | POA: Diagnosis not present

## 2022-07-27 DIAGNOSIS — Z0001 Encounter for general adult medical examination with abnormal findings: Secondary | ICD-10-CM | POA: Diagnosis not present

## 2022-07-27 DIAGNOSIS — E782 Mixed hyperlipidemia: Secondary | ICD-10-CM | POA: Diagnosis not present

## 2022-07-27 DIAGNOSIS — I10 Essential (primary) hypertension: Secondary | ICD-10-CM | POA: Diagnosis not present

## 2022-07-27 DIAGNOSIS — D649 Anemia, unspecified: Secondary | ICD-10-CM | POA: Diagnosis not present

## 2022-07-27 DIAGNOSIS — G479 Sleep disorder, unspecified: Secondary | ICD-10-CM | POA: Diagnosis not present

## 2022-10-22 DIAGNOSIS — E860 Dehydration: Secondary | ICD-10-CM | POA: Diagnosis not present

## 2022-10-22 DIAGNOSIS — M546 Pain in thoracic spine: Secondary | ICD-10-CM | POA: Diagnosis not present

## 2022-10-22 DIAGNOSIS — F0390 Unspecified dementia without behavioral disturbance: Secondary | ICD-10-CM | POA: Diagnosis not present

## 2022-10-22 DIAGNOSIS — I10 Essential (primary) hypertension: Secondary | ICD-10-CM | POA: Diagnosis not present

## 2022-11-27 DIAGNOSIS — L309 Dermatitis, unspecified: Secondary | ICD-10-CM | POA: Diagnosis not present

## 2022-11-27 DIAGNOSIS — H6123 Impacted cerumen, bilateral: Secondary | ICD-10-CM | POA: Diagnosis not present

## 2022-12-03 DIAGNOSIS — F0390 Unspecified dementia without behavioral disturbance: Secondary | ICD-10-CM | POA: Diagnosis not present

## 2022-12-03 DIAGNOSIS — I10 Essential (primary) hypertension: Secondary | ICD-10-CM | POA: Diagnosis not present

## 2022-12-03 DIAGNOSIS — Z593 Problems related to living in residential institution: Secondary | ICD-10-CM | POA: Diagnosis not present

## 2022-12-03 DIAGNOSIS — G609 Hereditary and idiopathic neuropathy, unspecified: Secondary | ICD-10-CM | POA: Diagnosis not present

## 2022-12-11 DIAGNOSIS — I10 Essential (primary) hypertension: Secondary | ICD-10-CM | POA: Diagnosis not present

## 2022-12-11 DIAGNOSIS — G479 Sleep disorder, unspecified: Secondary | ICD-10-CM | POA: Diagnosis not present

## 2022-12-11 DIAGNOSIS — D649 Anemia, unspecified: Secondary | ICD-10-CM | POA: Diagnosis not present

## 2022-12-11 DIAGNOSIS — K219 Gastro-esophageal reflux disease without esophagitis: Secondary | ICD-10-CM | POA: Diagnosis not present

## 2022-12-11 DIAGNOSIS — F0390 Unspecified dementia without behavioral disturbance: Secondary | ICD-10-CM | POA: Diagnosis not present

## 2022-12-11 DIAGNOSIS — E782 Mixed hyperlipidemia: Secondary | ICD-10-CM | POA: Diagnosis not present

## 2022-12-11 DIAGNOSIS — N189 Chronic kidney disease, unspecified: Secondary | ICD-10-CM | POA: Diagnosis not present

## 2022-12-11 DIAGNOSIS — G629 Polyneuropathy, unspecified: Secondary | ICD-10-CM | POA: Diagnosis not present

## 2022-12-11 DIAGNOSIS — Z6823 Body mass index (BMI) 23.0-23.9, adult: Secondary | ICD-10-CM | POA: Diagnosis not present

## 2023-09-08 DIAGNOSIS — G629 Polyneuropathy, unspecified: Secondary | ICD-10-CM | POA: Diagnosis not present

## 2023-09-08 DIAGNOSIS — Z6821 Body mass index (BMI) 21.0-21.9, adult: Secondary | ICD-10-CM | POA: Diagnosis not present

## 2023-09-08 DIAGNOSIS — I10 Essential (primary) hypertension: Secondary | ICD-10-CM | POA: Diagnosis not present

## 2023-09-08 DIAGNOSIS — Z23 Encounter for immunization: Secondary | ICD-10-CM | POA: Diagnosis not present

## 2023-09-08 DIAGNOSIS — G479 Sleep disorder, unspecified: Secondary | ICD-10-CM | POA: Diagnosis not present

## 2023-12-06 DIAGNOSIS — D649 Anemia, unspecified: Secondary | ICD-10-CM | POA: Diagnosis not present

## 2023-12-06 DIAGNOSIS — G479 Sleep disorder, unspecified: Secondary | ICD-10-CM | POA: Diagnosis not present

## 2023-12-06 DIAGNOSIS — Z0001 Encounter for general adult medical examination with abnormal findings: Secondary | ICD-10-CM | POA: Diagnosis not present

## 2023-12-06 DIAGNOSIS — F0390 Unspecified dementia without behavioral disturbance: Secondary | ICD-10-CM | POA: Diagnosis not present

## 2023-12-06 DIAGNOSIS — Z6821 Body mass index (BMI) 21.0-21.9, adult: Secondary | ICD-10-CM | POA: Diagnosis not present

## 2023-12-06 DIAGNOSIS — I10 Essential (primary) hypertension: Secondary | ICD-10-CM | POA: Diagnosis not present

## 2023-12-06 DIAGNOSIS — E782 Mixed hyperlipidemia: Secondary | ICD-10-CM | POA: Diagnosis not present

## 2023-12-06 DIAGNOSIS — N189 Chronic kidney disease, unspecified: Secondary | ICD-10-CM | POA: Diagnosis not present

## 2024-02-24 DIAGNOSIS — K219 Gastro-esophageal reflux disease without esophagitis: Secondary | ICD-10-CM | POA: Diagnosis not present

## 2024-02-24 DIAGNOSIS — Z6820 Body mass index (BMI) 20.0-20.9, adult: Secondary | ICD-10-CM | POA: Diagnosis not present

## 2024-02-24 DIAGNOSIS — D649 Anemia, unspecified: Secondary | ICD-10-CM | POA: Diagnosis not present

## 2024-02-24 DIAGNOSIS — N189 Chronic kidney disease, unspecified: Secondary | ICD-10-CM | POA: Diagnosis not present

## 2024-02-24 DIAGNOSIS — Z1331 Encounter for screening for depression: Secondary | ICD-10-CM | POA: Diagnosis not present

## 2024-02-24 DIAGNOSIS — L301 Dyshidrosis [pompholyx]: Secondary | ICD-10-CM | POA: Diagnosis not present

## 2024-02-24 DIAGNOSIS — Z0001 Encounter for general adult medical examination with abnormal findings: Secondary | ICD-10-CM | POA: Diagnosis not present

## 2024-02-24 DIAGNOSIS — F0390 Unspecified dementia without behavioral disturbance: Secondary | ICD-10-CM | POA: Diagnosis not present

## 2024-02-24 DIAGNOSIS — I10 Essential (primary) hypertension: Secondary | ICD-10-CM | POA: Diagnosis not present

## 2024-09-15 DIAGNOSIS — L309 Dermatitis, unspecified: Secondary | ICD-10-CM | POA: Diagnosis not present

## 2025-01-11 ENCOUNTER — Ambulatory Visit: Payer: Self-pay

## 2025-01-21 DEATH — deceased
